# Patient Record
Sex: Male | Born: 1950 | Race: Black or African American | Hispanic: No | Marital: Single | Smoking: Current some day smoker
Health system: Southern US, Community
[De-identification: ages and names within clinical notes are randomized; demographics above are authoritative.]

## PROBLEM LIST (undated history)

## (undated) DIAGNOSIS — B20 Human immunodeficiency virus [HIV] disease: Secondary | ICD-10-CM

---

## 1992-08-22 ENCOUNTER — Encounter: Payer: Self-pay | Admitting: Internal Medicine

## 1998-10-30 ENCOUNTER — Encounter: Payer: Self-pay | Admitting: Emergency Medicine

## 1998-10-30 ENCOUNTER — Emergency Department (HOSPITAL_COMMUNITY): Admission: EM | Admit: 1998-10-30 | Discharge: 1998-10-30 | Payer: Self-pay | Admitting: Emergency Medicine

## 1999-02-19 ENCOUNTER — Encounter: Payer: Self-pay | Admitting: Emergency Medicine

## 1999-02-19 ENCOUNTER — Emergency Department (HOSPITAL_COMMUNITY): Admission: EM | Admit: 1999-02-19 | Discharge: 1999-02-19 | Payer: Self-pay | Admitting: Emergency Medicine

## 1999-06-19 ENCOUNTER — Emergency Department (HOSPITAL_COMMUNITY): Admission: EM | Admit: 1999-06-19 | Discharge: 1999-06-19 | Payer: Self-pay | Admitting: *Deleted

## 2000-03-13 ENCOUNTER — Emergency Department (HOSPITAL_COMMUNITY): Admission: EM | Admit: 2000-03-13 | Discharge: 2000-03-14 | Payer: Self-pay | Admitting: Emergency Medicine

## 2000-03-24 ENCOUNTER — Emergency Department (HOSPITAL_COMMUNITY): Admission: EM | Admit: 2000-03-24 | Discharge: 2000-03-24 | Payer: Self-pay

## 2004-08-30 ENCOUNTER — Ambulatory Visit: Payer: Self-pay | Admitting: Internal Medicine

## 2005-03-25 ENCOUNTER — Inpatient Hospital Stay (HOSPITAL_COMMUNITY): Admission: EM | Admit: 2005-03-25 | Discharge: 2005-03-28 | Payer: Self-pay | Admitting: Emergency Medicine

## 2005-06-10 ENCOUNTER — Ambulatory Visit: Payer: Self-pay | Admitting: Internal Medicine

## 2006-06-16 ENCOUNTER — Ambulatory Visit: Payer: Self-pay | Admitting: Internal Medicine

## 2006-07-12 ENCOUNTER — Inpatient Hospital Stay (HOSPITAL_COMMUNITY): Admission: EM | Admit: 2006-07-12 | Discharge: 2006-07-18 | Payer: Self-pay | Admitting: Emergency Medicine

## 2006-07-14 ENCOUNTER — Ambulatory Visit: Payer: Self-pay | Admitting: Infectious Diseases

## 2006-07-24 ENCOUNTER — Ambulatory Visit: Payer: Self-pay | Admitting: Internal Medicine

## 2006-11-27 ENCOUNTER — Ambulatory Visit: Payer: Self-pay | Admitting: Internal Medicine

## 2007-07-16 ENCOUNTER — Ambulatory Visit: Payer: Self-pay | Admitting: Internal Medicine

## 2007-07-16 LAB — CONVERTED CEMR LAB
AST: 31 units/L (ref 0–37)
Alkaline Phosphatase: 115 units/L (ref 39–117)
Basophils Relative: 0 % (ref 0–1)
CD4 T Helper %: 20 % — ABNORMAL LOW (ref 32–62)
Calcium: 9.7 mg/dL (ref 8.4–10.5)
Eosinophils Absolute: 0.2 10*3/uL (ref 0.0–0.7)
HIV-1 RNA Quant, Log: 4.12 — ABNORMAL HIGH (ref <1.70)
Hemoglobin: 14.7 g/dL (ref 13.0–17.0)
Lymphocytes Relative: 16 % (ref 12–46)
MCV: 93.7 fL (ref 78.0–100.0)
Neutro Abs: 5.2 10*3/uL (ref 1.7–7.7)
Platelets: 232 10*3/uL (ref 150–400)
RDW: 13.2 % (ref 11.5–15.5)
Sodium: 137 meq/L (ref 135–145)
Total Bilirubin: 0.4 mg/dL (ref 0.3–1.2)
Total Lymphocytes %: 16 % (ref 12–46)
WBC, lymph enumeration: 7.4 10*3/uL (ref 4.0–10.5)

## 2007-08-27 ENCOUNTER — Ambulatory Visit: Payer: Self-pay | Admitting: Internal Medicine

## 2007-11-05 ENCOUNTER — Ambulatory Visit: Payer: Self-pay | Admitting: Internal Medicine

## 2007-11-06 ENCOUNTER — Encounter: Payer: Self-pay | Admitting: Internal Medicine

## 2007-11-06 LAB — CONVERTED CEMR LAB
BUN: 15 mg/dL (ref 6–23)
Basophils Absolute: 0 10*3/uL (ref 0.0–0.1)
Basophils Relative: 1 % (ref 0–1)
Chlamydia, Swab/Urine, PCR: NEGATIVE
Chloride: 102 meq/L (ref 96–112)
Creatinine, Ser: 1.35 mg/dL (ref 0.40–1.50)
HCT: 47.2 % (ref 39.0–52.0)
Hemoglobin: 15.2 g/dL (ref 13.0–17.0)
Lymphocytes Relative: 22 % (ref 12–46)
Lymphs Abs: 0.7 10*3/uL (ref 0.7–4.0)
MCHC: 32.2 g/dL (ref 30.0–36.0)
MCV: 94.4 fL (ref 78.0–100.0)
Monocytes Absolute: 0.4 10*3/uL (ref 0.1–1.0)
Monocytes Relative: 14 % — ABNORMAL HIGH (ref 3–12)
Neutro Abs: 1.9 10*3/uL (ref 1.7–7.7)
Neutrophils Relative %: 60 % (ref 43–77)
Platelets: 198 10*3/uL (ref 150–400)
Potassium: 4.1 meq/L (ref 3.5–5.3)
RBC: 5 M/uL (ref 4.22–5.81)
RDW: 13.5 % (ref 11.5–15.5)
Sodium: 138 meq/L (ref 135–145)

## 2008-04-08 ENCOUNTER — Encounter: Payer: Self-pay | Admitting: Internal Medicine

## 2008-04-28 ENCOUNTER — Ambulatory Visit: Payer: Self-pay | Admitting: Internal Medicine

## 2008-04-28 LAB — CONVERTED CEMR LAB
ALT: 19 units/L (ref 0–53)
AST: 29 units/L (ref 0–37)
Absolute CD4: 148 #/uL — ABNORMAL LOW (ref 381–1469)
BUN: 16 mg/dL (ref 6–23)
Basophils Absolute: 0 10*3/uL (ref 0.0–0.1)
Basophils Relative: 0 % (ref 0–1)
CD4 T Helper %: 15 % — ABNORMAL LOW (ref 32–62)
Calcium: 9.8 mg/dL (ref 8.4–10.5)
Creatinine, Ser: 1.49 mg/dL (ref 0.40–1.50)
HCT: 44 % (ref 39.0–52.0)
HIV 1 RNA Quant: 5310 copies/mL — ABNORMAL HIGH (ref <50)
Hemoglobin: 14 g/dL (ref 13.0–17.0)
Lymphocytes Relative: 26 % (ref 12–46)
Lymphs Abs: 1 10*3/uL (ref 0.7–4.0)
MCHC: 31.8 g/dL (ref 30.0–36.0)
MCV: 93.8 fL (ref 78.0–100.0)
Monocytes Absolute: 0.6 10*3/uL (ref 0.1–1.0)
Platelets: 221 10*3/uL (ref 150–400)
Potassium: 4.1 meq/L (ref 3.5–5.3)
RBC: 4.69 M/uL (ref 4.22–5.81)
RDW: 14 % (ref 11.5–15.5)
Sodium: 137 meq/L (ref 135–145)
Total Bilirubin: 0.5 mg/dL (ref 0.3–1.2)
Total Protein: 8.3 g/dL (ref 6.0–8.3)
Total lymphocyte count: 988 cells/mcL (ref 700–3300)
WBC, lymph enumeration: 3.8 10*3/uL — ABNORMAL LOW (ref 4.0–10.5)

## 2008-05-09 ENCOUNTER — Encounter: Payer: Self-pay | Admitting: Internal Medicine

## 2008-05-19 ENCOUNTER — Ambulatory Visit: Payer: Self-pay | Admitting: Internal Medicine

## 2008-08-29 ENCOUNTER — Ambulatory Visit: Payer: Self-pay | Admitting: Internal Medicine

## 2008-08-29 ENCOUNTER — Encounter: Payer: Self-pay | Admitting: Internal Medicine

## 2008-08-29 DIAGNOSIS — M25579 Pain in unspecified ankle and joints of unspecified foot: Secondary | ICD-10-CM

## 2008-08-29 DIAGNOSIS — F39 Unspecified mood [affective] disorder: Secondary | ICD-10-CM | POA: Insufficient documentation

## 2008-08-29 DIAGNOSIS — F1021 Alcohol dependence, in remission: Secondary | ICD-10-CM

## 2008-08-29 DIAGNOSIS — B20 Human immunodeficiency virus [HIV] disease: Secondary | ICD-10-CM

## 2008-08-29 DIAGNOSIS — A539 Syphilis, unspecified: Secondary | ICD-10-CM

## 2008-08-29 LAB — CONVERTED CEMR LAB
AST: 27 units/L (ref 0–37)
Absolute CD4: 103 #/uL — ABNORMAL LOW (ref 381–1469)
Basophils Absolute: 0 10*3/uL (ref 0.0–0.1)
CD4 T Helper %: 17 % — ABNORMAL LOW (ref 32–62)
Calcium: 9 mg/dL (ref 8.4–10.5)
Cholesterol: 178 mg/dL (ref 0–200)
Creatinine, Ser: 1.68 mg/dL — ABNORMAL HIGH (ref 0.40–1.50)
HDL: 47 mg/dL (ref >39)
HIV-1 RNA Quant, Log: 3.84 — ABNORMAL HIGH (ref <1.68)
Hemoglobin: 14.4 g/dL (ref 13.0–17.0)
MCV: 92.7 fL (ref 78.0–100.0)
Potassium: 4.5 meq/L (ref 3.5–5.3)
RBC: 4.94 M/uL (ref 4.22–5.81)
Sodium: 138 meq/L (ref 135–145)
Total Bilirubin: 0.4 mg/dL (ref 0.3–1.2)
Total Protein: 8.4 g/dL — ABNORMAL HIGH (ref 6.0–8.3)
Triglycerides: 252 mg/dL — ABNORMAL HIGH (ref <150)
VLDL: 50 mg/dL — ABNORMAL HIGH (ref 0–40)

## 2008-12-14 ENCOUNTER — Telehealth: Payer: Self-pay | Admitting: Internal Medicine

## 2009-01-11 ENCOUNTER — Telehealth: Payer: Self-pay | Admitting: Internal Medicine

## 2009-02-27 ENCOUNTER — Encounter: Payer: Self-pay | Admitting: Internal Medicine

## 2009-02-27 ENCOUNTER — Ambulatory Visit: Payer: Self-pay | Admitting: Internal Medicine

## 2009-02-27 LAB — CONVERTED CEMR LAB
AST: 29 units/L (ref 0–37)
Albumin: 4.2 g/dL (ref 3.5–5.2)
Basophils Absolute: 0 10*3/uL (ref 0.0–0.1)
CO2: 25 meq/L (ref 19–32)
Chloride: 99 meq/L (ref 96–112)
Eosinophils Absolute: 0.1 10*3/uL (ref 0.0–0.7)
HCT: 46.2 % (ref 39.0–52.0)
HIV 1 RNA Quant: 20000 copies/mL — ABNORMAL HIGH (ref <48)
HIV-1 RNA Quant, Log: 4.3 — ABNORMAL HIGH (ref <1.68)
Hemoglobin: 14.6 g/dL (ref 13.0–17.0)
Neutro Abs: 3.3 10*3/uL (ref 1.7–7.7)
Neutrophils Relative %: 68 % (ref 43–77)
Potassium: 4 meq/L (ref 3.5–5.3)
RBC: 4.87 M/uL (ref 4.22–5.81)
RDW: 14.9 % (ref 11.5–15.5)
Sodium: 139 meq/L (ref 135–145)
Total Protein: 8.6 g/dL — ABNORMAL HIGH (ref 6.0–8.3)
Total lymphocyte count: 900 cells/mcL (ref 700–3300)
WBC: 5 10*3/uL (ref 4.0–10.5)

## 2009-03-09 ENCOUNTER — Encounter: Payer: Self-pay | Admitting: Internal Medicine

## 2009-03-09 ENCOUNTER — Ambulatory Visit: Payer: Self-pay | Admitting: Internal Medicine

## 2009-06-06 ENCOUNTER — Telehealth: Payer: Self-pay | Admitting: Internal Medicine

## 2009-07-07 ENCOUNTER — Encounter: Payer: Self-pay | Admitting: Internal Medicine

## 2009-08-21 ENCOUNTER — Telehealth: Payer: Self-pay | Admitting: Internal Medicine

## 2009-08-24 ENCOUNTER — Encounter: Payer: Self-pay | Admitting: Internal Medicine

## 2009-08-24 ENCOUNTER — Ambulatory Visit: Payer: Self-pay | Admitting: Internal Medicine

## 2009-08-24 LAB — CONVERTED CEMR LAB
ALT: 20 units/L (ref 0–53)
Alkaline Phosphatase: 109 units/L (ref 39–117)
BUN: 14 mg/dL (ref 6–23)
Basophils Absolute: 0 10*3/uL (ref 0.0–0.1)
Chloride: 101 meq/L (ref 96–112)
Creatinine, Ser: 1.54 mg/dL — ABNORMAL HIGH (ref 0.40–1.50)
Eosinophils Relative: 5 % (ref 0–5)
Glucose, Bld: 89 mg/dL (ref 70–99)
HCT: 41.6 % (ref 39.0–52.0)
Lymphocytes Relative: 14 % (ref 12–46)
Monocytes Absolute: 0.5 10*3/uL (ref 0.1–1.0)
Neutro Abs: 3.1 10*3/uL (ref 1.7–7.7)
Sodium: 136 meq/L (ref 135–145)
Total Bilirubin: 0.3 mg/dL (ref 0.3–1.2)

## 2009-09-27 ENCOUNTER — Telehealth: Payer: Self-pay | Admitting: Internal Medicine

## 2009-12-01 ENCOUNTER — Telehealth: Payer: Self-pay | Admitting: Internal Medicine

## 2010-02-04 ENCOUNTER — Emergency Department (HOSPITAL_COMMUNITY): Admission: EM | Admit: 2010-02-04 | Discharge: 2010-02-04 | Payer: Self-pay | Admitting: Emergency Medicine

## 2010-03-02 ENCOUNTER — Telehealth: Payer: Self-pay | Admitting: Internal Medicine

## 2010-03-08 ENCOUNTER — Telehealth (INDEPENDENT_AMBULATORY_CARE_PROVIDER_SITE_OTHER): Payer: Self-pay | Admitting: *Deleted

## 2010-03-08 ENCOUNTER — Ambulatory Visit: Payer: Self-pay | Admitting: Internal Medicine

## 2010-03-08 LAB — CONVERTED CEMR LAB
AST: 34 units/L (ref 0–37)
BUN: 12 mg/dL (ref 6–23)
Eosinophils Relative: 3 % (ref 0–5)
Glucose, Bld: 109 mg/dL — ABNORMAL HIGH (ref 70–99)
HIV 1 RNA Quant: <20 copies/mL (ref <48)
HIV-1 RNA Quant, Log: <1.3 (ref <1.68)
Hemoglobin: 14.3 g/dL (ref 13.0–17.0)
MCHC: 32.7 g/dL (ref 30.0–36.0)
MCV: 95.2 fL (ref 78.0–100.0)
Monocytes Absolute: 0.4 10*3/uL (ref 0.1–1.0)
Neutro Abs: 1.9 10*3/uL (ref 1.7–7.7)
Neutrophils Relative %: 51 % (ref 43–77)
RBC: 4.59 M/uL (ref 4.22–5.81)
RDW: 13.7 % (ref 11.5–15.5)
Total Bilirubin: 0.5 mg/dL (ref 0.3–1.2)
WBC: 3.8 10*3/uL — ABNORMAL LOW (ref 4.0–10.5)

## 2010-03-21 ENCOUNTER — Ambulatory Visit: Payer: Self-pay | Admitting: Internal Medicine

## 2010-03-28 ENCOUNTER — Telehealth (INDEPENDENT_AMBULATORY_CARE_PROVIDER_SITE_OTHER): Payer: Self-pay | Admitting: *Deleted

## 2010-04-02 ENCOUNTER — Encounter: Payer: Self-pay | Admitting: Internal Medicine

## 2010-05-04 ENCOUNTER — Encounter: Payer: Self-pay | Admitting: Internal Medicine

## 2010-05-21 ENCOUNTER — Encounter: Payer: Self-pay | Admitting: Internal Medicine

## 2010-06-13 ENCOUNTER — Emergency Department (HOSPITAL_COMMUNITY)
Admission: EM | Admit: 2010-06-13 | Discharge: 2010-06-13 | Payer: Self-pay | Source: Home / Self Care | Admitting: Emergency Medicine

## 2010-06-14 ENCOUNTER — Emergency Department (HOSPITAL_COMMUNITY)
Admission: EM | Admit: 2010-06-14 | Discharge: 2010-06-14 | Payer: Self-pay | Source: Home / Self Care | Admitting: Emergency Medicine

## 2010-07-31 NOTE — Progress Notes (Signed)
Summary: med refill  Phone Note Refill Request   Refills Requested: Medication #1:  EFFEXOR XR 150 MG XR24H-CAP Take 1 tablet by mouth once a day   Dosage confirmed as above?Dosage Confirmed   Brand Name Necessary? No   Supply Requested: 1 month Needs an appt.   Method Requested: Telephone to Pharmacy Next Appointment Scheduled: seen Sept.2010 Initial call taken by: Sharen Heck RN,  August 21, 2009 5:39 PM    Prescriptions: EFFEXOR XR 150 MG XR24H-CAP (VENLAFAXINE HCL) Take 1 tablet by mouth once a day  #30 x 3   Entered by:   Sharen Heck RN   Authorized by:   Yisroel Ramming MD   Signed by:   Yisroel Ramming MD on 08/22/2009   Method used:   Telephoned to ...         RxID:   [id number]  Called to [facility] [address] Ginette Otto [phone].

## 2010-07-31 NOTE — Progress Notes (Signed)
Summary: Refill request for Xanax 2mg   Phone Note From Pharmacy   Caller: [facility] [address] Reason for Call: Needs renewal Summary of Call: request for refill on Alprazolam 2mg .  Patient was just seen in office on 03/21/10  Follow-up for Phone Call        ok with 3 refills Follow-up by: Yisroel Ramming MD,  March 29, 2010 9:35 AM  Additional Follow-up for Phone Call Additional follow up Details #1::        Pharmacy notified Additional Follow-up by: Paulo Fruit  BS,CPht II,MPH,  March 29, 2010 9:49 AM

## 2010-07-31 NOTE — Progress Notes (Signed)
Summary: refill/mld  Phone Note Call from Patient   Caller: Patient Reason for Call: Refill Medication Summary of Call: pateint called about his refill on the Gabapentin.  He uses [address]  He has scheduled a lab appt for 9/7 and a f/u 2 weeks after that. Initial call taken by: Paulo Fruit  BS,CPht II,MPH,  March 02, 2010 4:08 PM  Follow-up for Phone Call        spoke to [place] who stated to refill one time and must keep appt.  Patient aware. Follow-up by: Paulo Fruit  BS,CPht II,MPH,  March 02, 2010 4:08 PM    Prescriptions: NEURONTIN 400 MG CAPS (GABAPENTIN) Take 1 tablet by mouth three times a day  #90 Each x 0   Entered by:   Paulo Fruit  BS,CPht II,MPH   Authorized by:   Yisroel Ramming MD   Signed by:   Paulo Fruit  BS,CPht II,MPH on 03/02/2010   Method used:   Electronically to        [facility] [address] [id number] (retail)       [address] [place], [place]  [address]       Ph: [phone]       Fax: [phone]   RxID:   [id number]  Paulo Fruit  BS,CPht II,MPH  March 02, 2010 4:09 PM

## 2010-07-31 NOTE — Miscellaneous (Signed)
Summary: RW Update  Clinical Lists Changes  Observations: Added new observation of DATE1STVISIT: 03/21/2010 (04/02/2010 16:03) Added new observation of RWPARTICIP: Yes (04/02/2010 16:03)

## 2010-07-31 NOTE — Miscellaneous (Signed)
Summary: documentation of flu injection  Clinical Lists Changes  Observations: Added new observation of FLU VAX: Historical (04/03/2009 11:48)      Influenza Immunization History:    Influenza # 1:  Historical (04/03/2009)     Sharen Heck RN

## 2010-07-31 NOTE — Miscellaneous (Signed)
Summary: RW  Update  Clinical Lists Changes 

## 2010-07-31 NOTE — Miscellaneous (Signed)
Summary: HIPAA Restrictions  HIPAA Restrictions   Imported By: Florinda Marker 03/09/2010 10:58:40  _____________________________________________________________________  External Attachment:    Type:   Image     Comment:   External Document

## 2010-07-31 NOTE — Miscellaneous (Signed)
Summary: Orders Update  Clinical Lists Changes  Orders: Added new Test order of T-Comprehensive Metabolic Panel ([id number]) - Signed Added new Test order of T-CBC w/Diff (85025-10010) - Signed Added new Test order of T-CD4SP ([facility]) (CD4SP) - Signed Added new Test order of T-HIV Viral Load ([id number]) - Signed 

## 2010-07-31 NOTE — Assessment & Plan Note (Signed)
Summary: F/U [MKJ]   CC:  follow-up visit, lab results, pt. c/o stress and anxiety, and Depression.  History of Present Illness: Pt c/o some anxiety. He has been taking his HIV meds every day.  Depression History:      The patient is having a depressed mood most of the day but denies diminished interest in his usual daily activities.        The patient denies that he feels like life is not worth living, denies that he wishes that he were dead, and denies that he has thought about ending his life.        Preventive Screening-Counseling & Management  Alcohol-Tobacco     Alcohol drinks/day: <1     Alcohol type: vodka     Smoking Status: current     Smoking Cessation Counseling: yes     Packs/Day: <0.25  Caffeine-Diet-Exercise     Caffeine use/day: 1     Does Patient Exercise: yes     Type of exercise: walking     Exercise (avg: min/session): <30     Times/week: <3  Safety-Violence-Falls     Seat Belt Use: yes   Updated Prior Medication List: EFFEXOR XR 150 MG XR24H-CAP (VENLAFAXINE HCL) Take 1 tablet by mouth once a day NEURONTIN 400 MG CAPS (GABAPENTIN) Take 1 tablet by mouth three times a day TRUVADA 200-300 MG TABS (EMTRICITABINE-TENOFOVIR) Take 1 tablet by mouth once a day ISENTRESS 400 MG TABS (RALTEGRAVIR POTASSIUM) Take 1 tablet by mouth two times a day ALPRAZOLAM 2 MG TABS (ALPRAZOLAM) Take 1 tablet by mouth three times a day  Current Allergies (reviewed today): ! PCN Review of Systems  The patient denies anorexia, fever, and weight loss.    Vital Signs:  Patient profile:   60 year old male Height:      70.5 inches (179.07 cm) Weight:      201.8 pounds (91.73 kg) BMI:     28.65 Temp:     98.3 degrees F (36.83 degrees C) oral Pulse rate:   100 / minute BP sitting:   154 / 89  (right arm)  Vitals Entered By: Wendall Mola CMA Duncan Dull) (March 21, 2010 4:04 PM) CC: follow-up visit, lab results, pt. c/o stress and anxiety, Depression Is Patient  Diabetic? No Pain Assessment Patient in pain? no      Nutritional Status BMI of 25 - 29 = overweight Nutritional Status Detail appetite "good"  Does patient need assistance? Functional Status Self care Ambulation Normal Comments no missed doses of HAART meds per pt.   Physical Exam  General:  alert, well-developed, well-nourished, and well-hydrated.   Head:  normocephalic and atraumatic.   Mouth:  pharynx pink and moist.   Lungs:  normal breath sounds.      Impression & Recommendations:  Problem # 1:  HIV DISEASE (ICD-042) Pt.s most recent CD4ct was 200 and VL <20 .  Pt instructed to continue the current antiretroviral regimen.  Pt encouraged to take medication regularly and not miss doses.  Pt will f/u in 3 months for repeat blood work and will see me 2 weeks later. Influenza vaccine today.  The following medications were removed from the medication list:    Bactrim Ds 800-160 Mg Tabs (Sulfamethoxazole-trimethoprim) .Marland Kitchen... Take 1 tablet by mouth once a day  Diagnostics Reviewed:  HIV: CDC-defined AIDS (08/24/2009)   CD4: 200 (03/09/2010)   WBC: 3.8 (03/08/2010)   PMN (bands): 0 (08/29/2008)   Hgb: 14.3 (03/08/2010)   HCT: 43.7 (  03/08/2010)   Platelets: 199 (03/08/2010) HIV genotype: See Comment (02/27/2009)   HIV-1 RNA: <20 copies/mL (03/08/2010)     Problem # 2:  MOOD DISORDER (ICD-296.90) refer to Psychiatrist  Other Orders: Est. Patient Level III (14782) Influenza Vaccine MCR (95621) Future Orders: T-CD4SP ([facility]) (CD4SP) ... 06/19/2010 T-HIV Viral Load [phone]) ... 06/19/2010 T-Comprehensive Metabolic Panel [phone]) ... 06/19/2010 T-CBC w/Diff ([id number]) ... 06/19/2010 T-Lipid Profile [phone]) ... 06/19/2010  Patient Instructions: 1)  Please schedule a follow-up appointment in 3 months, 2 weeks after labs.      Immunizations Administered:  Influenza Vaccine # 1:    Vaccine Type: Fluvax MCR    Site: right deltoid    Mfr: Novartis     Dose: 0.5 ml    Route: IM    Given by: Wendall Mola CMA ( AAMA)    Exp. Date: 09/30/2010    Lot #: [id number]    VIS given: 01/23/10 version given March 21, 2010.  Flu Vaccine Consent Questions:    Do you have a history of severe allergic reactions to this vaccine? no    Any prior history of allergic reactions to egg and/or gelatin? no    Do you have a sensitivity to the preservative Thimersol? no    Do you have a past history of Guillan-Barre Syndrome? no    Do you currently have an acute febrile illness? no    Have you ever had a severe reaction to latex? no    Vaccine information given and explained to patient? yes

## 2010-07-31 NOTE — Progress Notes (Signed)
Summary: refill request for Xanax  Phone Note From Pharmacy   Reason for Call: Needs renewal Summary of Call: [facility] [address] request a renewal on patient's behalf for Xanax. Initial call taken by: Paulo Fruit  BS,CPht II,MPH,  December 01, 2009 12:10 PM  Follow-up for Phone Call        ok x 3 Follow-up by: Yisroel Ramming MD,  December 01, 2009 2:30 PM  Additional Follow-up for Phone Call Additional follow up Details #1::        Prescription resent Additional Follow-up by: Paulo Fruit  BS,CPht II,MPH,  December 01, 2009 4:22 PM

## 2010-07-31 NOTE — Progress Notes (Signed)
Summary: Refill Effexor  Phone Note Call from Patient   Caller: Patient Summary of Call: Pt. came in for labs and needed refill on Effexor, refill [facility] to [facility] at [address] x 1 month.  Pt. will need to ask for more refills at office visit. Initial call taken by: Wendall Mola CMA Duncan Dull),  March 08, 2010 3:54 PM

## 2010-07-31 NOTE — Progress Notes (Signed)
Summary: call re Xanax  Phone Note Call from Patient   Reason for Call: Talk to Nurse Summary of Call: Patient thinks someone may have taken his Xanax. Pt. advised rx was given over a month ago and should be able to go ahead and get a refill. Initial call taken by: Sharen Heck RN,  September 27, 2009 3:24 PM  Follow-up for Phone Call        ok Follow-up by: Yisroel Ramming MD,  September 28, 2009 11:11 AM

## 2010-07-31 NOTE — Miscellaneous (Signed)
Summary: Office Visit ([facility])    Vital Signs:  Patient profile:   60 year old male Weight:      205.4 pounds Temp:     97.9 degrees F oral Pulse rate:   85 / minute Pulse rhythm:   regular Resp:     18 per minute BP sitting:   127 / 73  Vitals Entered By: Sharen Heck RN (August 24, 2009 3:22 PM) CC: f/u 05, reports mental stress Is Patient Diabetic? No Pain Assessment Patient in pain? no       Does patient need assistance? Functional Status Self care Ambulation Normal   CC:  f/u 05 and reports mental stress.  History of Present Illness: Pt thinks he needs something stronger for his anxiety. He has been taking his HIV meds every day. He has not had any side effects.  Preventive Screening-Counseling & Management  Alcohol-Tobacco     Alcohol drinks/day: <1     Alcohol type: vodka     Smoking Status: current     Smoking Cessation Counseling: yes     Packs/Day: <0.25  Caffeine-Diet-Exercise     Caffeine use/day: 1     Does Patient Exercise: yes     Type of exercise: walking     Exercise (avg: min/session): <30     Times/week: <3  Current Problems (verified): 1)  Ankle Pain, Right  (ICD-719.47) 2)  Alcohol Abuse, Hx of  (ICD-V11.3) 3)  Mood Disorder  (ICD-296.90) 4)  Hx of Syphilis  (ICD-097.9) 5)  HIV Disease  (ICD-042)  Current Medications (verified): 1)  Effexor Xr 150 Mg Xr24h-Cap (Venlafaxine Hcl) .... Take 1 Tablet By Mouth Once A Day 2)  Bactrim Ds 800-160 Mg Tabs (Sulfamethoxazole-Trimethoprim) .... Take 1 Tablet By Mouth Once A Day 3)  Neurontin 400 Mg Caps (Gabapentin) .... Take 1 Tablet By Mouth Three Times A Day 4)  Truvada 200-300 Mg Tabs (Emtricitabine-Tenofovir) .... Take 1 Tablet By Mouth Once A Day 5)  Isentress 400 Mg Tabs (Raltegravir Potassium) .... Take 1 Tablet By Mouth Two Times A Day 6)  Alprazolam 2 Mg Tabs (Alprazolam) .... Take 1 Tablet By Mouth Three Times A Day  Allergies: 1)  ! Pcn   Review of Systems  The  patient denies anorexia, fever, and weight loss.     Physical Exam  General:  alert, well-developed, well-nourished, and well-hydrated.   Head:  normocephalic and atraumatic.   Mouth:  pharynx pink and moist.   Lungs:  normal breath sounds.          Medication Adherence: 08/24/2009   Adherence to medications reviewed with patient. Counseling to provide adequate adherence provided   Prevention For Positives: 08/24/2009   Safe sex practices discussed with patient. Condoms offered.                             Impression & Recommendations:  Problem # 1:  HIV DISEASE (ICD-042) Will obtain labs today. Pt to continue his current meds. Diagnostics Reviewed:  WBC: 5.0 (02/27/2009)   PMN (bands): 0 (08/29/2008)   Hgb: 14.6 (02/27/2009)   HCT: 46.2 (02/27/2009)   Platelets: 217 (02/27/2009) HIV genotype: See Comment (02/27/2009)   HIV-1 RNA: 20000 (02/27/2009)     Problem # 2:  MOOD DISORDER (ICD-296.90) Will increase his xanax to 2mg  three times a day.  Medications Added to Medication List This Visit: 1)  Alprazolam 2 Mg Tabs (Alprazolam) .... Take 1 tablet by  mouth three times a day  Other Orders: T-CD4SP [facility]) (CD4SP) T-HIV Viral Load [phone]) T-Comprehensive Metabolic Panel [phone]) T-CBC w/Diff [phone]) T-RPR (Syphilis) ([id number]) Est. Patient Level III (28413)    Patient Instructions: 1)  Please schedule a follow-up appointment in 3 months. Prescriptions: ALPRAZOLAM 2 MG TABS (ALPRAZOLAM) Take 1 tablet by mouth three times a day  #90 x 2   Entered and Authorized by:   Yisroel Ramming MD   Signed by:   Yisroel Ramming MD on 08/24/2009   Method used:   Print then Give to Patient   RxID:   [id number]

## 2010-07-31 NOTE — Miscellaneous (Signed)
Summary: RW Update  Clinical Lists Changes  Observations: Added new observation of PAYOR: Medicaid (05/04/2010 10:35) Added new observation of GENDER: Male (05/04/2010 10:35) Added new observation of RACE: African American (05/04/2010 10:35)

## 2010-08-20 ENCOUNTER — Encounter (INDEPENDENT_AMBULATORY_CARE_PROVIDER_SITE_OTHER): Payer: Self-pay | Admitting: *Deleted

## 2010-08-28 NOTE — Miscellaneous (Signed)
  Clinical Lists Changes  Observations: Added new observation of HIV RISK BEH: MSM (08/20/2010 11:15)

## 2010-09-11 ENCOUNTER — Telehealth: Payer: Self-pay | Admitting: Infectious Diseases

## 2010-09-13 LAB — T-HELPER CELL (CD4) - (RCID CLINIC ONLY): CD4 % Helper T Cell: 16 % — ABNORMAL LOW (ref 33–55)

## 2010-09-14 LAB — CBC
MCH: 31.7 pg (ref 26.0–34.0)
MCV: 96.2 fL (ref 78.0–100.0)
Platelets: 200 10*3/uL (ref 150–400)
RBC: 4.76 MIL/uL (ref 4.22–5.81)
RDW: 13.8 % (ref 11.5–15.5)

## 2010-09-14 LAB — COMPREHENSIVE METABOLIC PANEL
ALT: 31 U/L (ref 0–53)
BUN: 12 mg/dL (ref 6–23)
Calcium: 8.9 mg/dL (ref 8.4–10.5)
Creatinine, Ser: 1.25 mg/dL (ref 0.4–1.5)
GFR calc Af Amer: >60 mL/min (ref >60)
Potassium: 3.7 mEq/L (ref 3.5–5.1)
Sodium: 143 mEq/L (ref 135–145)

## 2010-09-14 LAB — DIFFERENTIAL
Basophils Absolute: 0 10*3/uL (ref 0.0–0.1)
Lymphocytes Relative: 26 % (ref 12–46)
Monocytes Relative: 8 % (ref 3–12)
Neutrophils Relative %: 62 % (ref 43–77)

## 2010-09-14 LAB — URINALYSIS, ROUTINE W REFLEX MICROSCOPIC
Hgb urine dipstick: NEGATIVE
Specific Gravity, Urine: 1.009 (ref 1.005–1.030)

## 2010-09-27 NOTE — Progress Notes (Signed)
  Phone Note Call from Patient   Caller: Patient Reason for Call: Refill Medication, Referral Action Taken: Phone Call Completed Summary of Call: wanted xanax refilled at higher dose.states he sometimes takes it 5 times a day & it is not working. unclear if he is taking his antidepressant or not.states he is worried about being thrown out of public housing & becoming homeless again. states he is very anxious and depressed. denies SI but states it would be nice to see his mom & dog again. states his religion prohibits suicide & he is "too chicken" has a case worker at [employer]. not currently under mental health provider. I told him we will not change meds & he is due for a visit. he will call & make an appt here to see new provider & discuss meds. [place] is on a bus line. also gave him the number to the [facility] center & he promised to call them. told him where it is & it is on a bus line. told him how good they are at helping & perhaps he needed different meds ot speaking with a therapist. he did not feel he needed to talk with anyone. his speech was fast & tangential. told him they have a 24 hr walk in side to [facility] center if he felt out of control.  Initial call taken by: Golden Circle RN,  September 11, 2010 2:34 PM  Follow-up for Phone Call        same request & message. he has an appt at [facility] center on the 30th.encouraged him to keep it as his appt here is later than that. suggested hanging with friends, watching TV or reading as a distraction. told him if he uses more than is ordered, he will run out  before it is time for a refill.  Follow-up by: Golden Circle RN,  September 17, 2010 3:08 PM

## 2010-10-01 ENCOUNTER — Other Ambulatory Visit (INDEPENDENT_AMBULATORY_CARE_PROVIDER_SITE_OTHER): Payer: Medicare Other

## 2010-10-01 DIAGNOSIS — B20 Human immunodeficiency virus [HIV] disease: Secondary | ICD-10-CM

## 2010-10-02 LAB — COMPLETE METABOLIC PANEL WITH GFR
ALT: 19 U/L (ref 0–53)
Albumin: 4.4 g/dL (ref 3.5–5.2)
BUN: 15 mg/dL (ref 6–23)
GFR, Est African American: 55 mL/min — ABNORMAL LOW (ref >60)
Glucose, Bld: 127 mg/dL — ABNORMAL HIGH (ref 70–99)
Sodium: 140 mEq/L (ref 135–145)
Total Bilirubin: 0.4 mg/dL (ref 0.3–1.2)
Total Protein: 7.8 g/dL (ref 6.0–8.3)

## 2010-10-02 LAB — LIPID PANEL
Cholesterol: 178 mg/dL (ref 0–200)
HDL: 43 mg/dL (ref >39)
Total CHOL/HDL Ratio: 4.1 Ratio
Triglycerides: 217 mg/dL — ABNORMAL HIGH (ref <150)
VLDL: 43 mg/dL — ABNORMAL HIGH (ref 0–40)

## 2010-10-02 LAB — CBC WITH DIFFERENTIAL/PLATELET
Basophils Relative: 0 % (ref 0–1)
Eosinophils Absolute: 0.1 10*3/uL (ref 0.0–0.7)
Eosinophils Relative: 2 % (ref 0–5)
MCH: 32 pg (ref 26.0–34.0)
MCV: 97.1 fL (ref 78.0–100.0)
Monocytes Absolute: 0.3 10*3/uL (ref 0.1–1.0)
Neutro Abs: 2.8 10*3/uL (ref 1.7–7.7)
Platelets: 223 10*3/uL (ref 150–400)
RBC: 5.16 MIL/uL (ref 4.22–5.81)
WBC: 3.8 10*3/uL — ABNORMAL LOW (ref 4.0–10.5)

## 2010-10-02 LAB — T-HELPER CELL (CD4) - (RCID CLINIC ONLY): CD4 % Helper T Cell: 23 % — ABNORMAL LOW (ref 33–55)

## 2010-10-03 ENCOUNTER — Other Ambulatory Visit: Payer: Self-pay | Admitting: Infectious Diseases

## 2010-10-03 LAB — HIV-1 RNA QUANT-NO REFLEX-BLD
HIV 1 RNA Quant: <20 copies/mL (ref <20)
HIV-1 RNA Quant, Log: <1.3 {Log} (ref <1.30)

## 2010-10-15 ENCOUNTER — Ambulatory Visit: Payer: Self-pay | Admitting: Infectious Diseases

## 2010-10-19 ENCOUNTER — Ambulatory Visit: Payer: Self-pay | Admitting: Infectious Diseases

## 2010-11-15 ENCOUNTER — Other Ambulatory Visit: Payer: Self-pay | Admitting: *Deleted

## 2010-11-15 DIAGNOSIS — B20 Human immunodeficiency virus [HIV] disease: Secondary | ICD-10-CM

## 2010-11-15 DIAGNOSIS — F419 Anxiety disorder, unspecified: Secondary | ICD-10-CM

## 2010-11-15 MED ORDER — ALPRAZOLAM 2 MG PO TABS: 2.0000 mg | ORAL_TABLET | Freq: Three times a day (TID) | ORAL | Status: DC | PRN

## 2010-11-15 MED ORDER — EMTRICITABINE-TENOFOVIR DF 200-300 MG PO TABS: 1.0000 | ORAL_TABLET | Freq: Every day | ORAL | Status: DC

## 2010-11-16 NOTE — Discharge Summary (Signed)
NAME:  David English, David English NO.:  [id number]   MEDICAL RECORD NO.:  [id number]          PATIENT TYPE:  INP   LOCATION:  1621                         FACILITY:  [facility]   PHYSICIAN:  Theone Stanley, MD   DATE OF BIRTH:  07-14-1950   DATE OF ADMISSION:  03/25/2005  DATE OF DISCHARGE:                                 DISCHARGE SUMMARY   ADMISSION DIAGNOSES:  1.  Cough, malaise, rhinorrhea, fever.  2.  Depression.  3.  Psoriasis.  4.  HIV positive since 2000.   DISCHARGE DIAGNOSES:  1.  Pneumonia, right upper lobe.  2.  Cough, malaise, rhinorrhea, fever.  3.  Depression.  4.  Psoriasis.  5.  HIV positive since 2000.   COMPLICATIONS:  None.   PROCEDURES:  None.   HOSPITAL COURSE:  [name] is a very pleasant 60 year old gentleman who  has known about his HIV positive test since 2000.  He is followed by Dr.  Philipp Deputy who monitors his CD4 count.  He recently had one.  However, he does  not know the level.  On his admission, it was noted that he had a right  upper lobe pneumonia.  Because of the presentation and his status, it was  deemed best to admit the patient for IV antibiotics. The patient was  admitted, started on azithromycin and Rocephin.  Over the next couple of  days, the patient improved dramatically.  It was felt that he was stable  enough to be discharged.  Of note, his pressures here in the hospital were  quite elevated at times.  I did give him a dose of Lasix and started him on  hydrochlorothiazide.  I do not know whether this is secondary to his current  illness or him being on Effexor.  He states that when he goes to the clinic,  nobody ever mentions anything about blood pressure.  However, since he is  elevated, it is best to start him on something here in the hospital with  follow up at his primary care physician. The patient was discharged on  September 28 in stable condition.   DISCHARGE MEDICATIONS:  1.  Effexor 150 mg one p.o. daily.  2.   Hydrochlorothiazide 12.5 mg one p.o. daily.  I gave him a total of 7      pills.  He is to follow up, and Dr. Philipp Deputy will decide to continue with      this or discontinue it.  3.  Ceftin 500 mg one p.o. b.i.d. for an additional 7 days.  4.  Guaifenesin 600 mg one p.o. b.i.d.   DISCHARGE INSTRUCTIONS:  The patient is to follow up with Dr. Philipp Deputy in 5-7  days.  At that point in time, blood pressure check will be done and Dr.  Philipp Deputy will decide whether to continue with his medications.      Theone Stanley, MD  Electronically Signed     AEJ/MEDQ  D:  03/28/2005  T:  03/28/2005  Job:  [phone] [id number]   cc:   Tresa Endo L. Philipp Deputy, M.D.  Fax: [phone]

## 2010-11-16 NOTE — Discharge Summary (Signed)
NAME:  David English, David English             ACCOUNT NO.:  [id number]   MEDICAL RECORD NO.:  [id number]          PATIENT TYPE:  INP   LOCATION:  1417                         FACILITY:  [facility]   PHYSICIAN:  Hind I Elsaid, MD      DATE OF BIRTH:  1950-08-04   DATE OF ADMISSION:  07/11/2006  DATE OF DISCHARGE:                               DISCHARGE SUMMARY   DISCHARGE DIAGNOSES:  1. Bilateral community-acquired pneumonia.  2. Human immunodeficiency virus with a CD4 of 240.  3. History of depression.  4. Acute renal failure, which was resolved.  5. Hypokalemia, resolved.   DISCHARGE MEDICATIONS:  1. Avelox 400 mg p.o. daily times 2 weeks.  2. predinsone  20 mg p.o. daily, taper dose for 4 days.  3. Diflucan 100 mg p.o. daily for oral thrush x5 days.   CONSULTATIONS:  ID consult.   PROCEDURE:  1. Chest x-ray on January 11th, bilateral infiltrate.  2. Chest x-ray on January 14th, air space disease in the left hilum      with pneumonia, __________ in the lung base, consistent with      pleural effusion.  3. Chest x-ray on January 17th, one base is improved.  No new      findings.   HISTORY OF PRESENT ILLNESS:  A 60 year old African-American male with a  history of HIV  .  Admitted to worse cough with sputum.  Chest x-ray as  above.   PROBLEM LIST:  1. Shortness of breath and diagnosis of community-acquired pneumonia      .patient was admitted to tele for close observation,blood culture      was neg ,  sputum for PCP was negative.  ID was consulted.  They      recommended patient to continue on IV antibiotics and to discharge      on Avelox.  Patient's condition significantly improved.  Patient      was discharged on Avelox to continue for two weeks.  2. HIV positive: with CD4 count 230,patient is not on any HAART      medicine  .  Patient is to follow with the infectious disease      clinic at [facility].  3. Depression:  Patient is to resume Effexor.  4. Acute renal failure:  which  respond to hydration   DISPOSITION:  Patient is going to be discharged home on Avelox for two  weeks and is to follow up with ID next week.  __________ .      Hind Bosie Helper, MD  Electronically Signed     HIE/MEDQ  D:  07/18/2006  T:  07/18/2006  Job:  [id number]

## 2010-11-16 NOTE — H&P (Signed)
NAME:  David English, CERASOLI NO.:  [id number]   MEDICAL RECORD NO.:  [id number]          PATIENT TYPE:  INP   LOCATION:  1621                         FACILITY:  [facility]   PHYSICIAN:  Sherin Quarry, MD      DATE OF BIRTH:  1951-02-11   DATE OF ADMISSION:  03/25/2005  DATE OF DISCHARGE:                                HISTORY & PHYSICAL   HISTORY OF PRESENT ILLNESS:  David English. David English is a 60 year old man who  states that he has been aware that he is HIV positive since 2000. As far as  I can tell from talking to him he has never taken any specific treatment. He  states that Dr. Philipp Deputy monitors his CD4 count but he is not sure what the  results have been. He states this was last checked 3 months ago. Mr. Mayabb  recalls that in 2001 he received treatment for what sounds like a routine  outpatient pneumonia. On Friday he indicates that he began to experience  cough, malaise and rhinorrhea and that night had a chill although it was not  a shaking chill, associated with rigors. Since then he has had proceed  persistent malaise, anorexia, chest aching and productive cough. By today,  i.e. Monday and it became clear to him that this was not going to get better  by itself and he therefore came to the [facility] emergency room for  evaluation. In the emergency room a chest x-ray was obtained which showed  evidence of a right upper lobe pneumonia. Given his immunocompromised state  it was felt prudent to admit him to the hospital for further treatment. He  denies any associated headache, visual blurring, nausea, vomiting, abdominal  pain or dysuria.   PAST MEDICAL HISTORY:   MEDICATIONS:  1.  Effexor 150 milligrams daily.  2.  A cream for psoriasis.   ALLERGIES:  He states that he is allergic to PENICILLIN but is not sure  about the details of this.   PAST SURGICAL HISTORY:  Operations are none.  medical illnesses.   MEDICAL ILLNESSES:  Are none except as stated above. The  patient states that  the Effexor seems to be useful in managing symptoms of depression. He is not  currently utilizing any treatment for psoriasis.   FAMILY HISTORY:  His father actually died of pneumonia in 13. His mother  died in 34 of heart disease. He is not having siblings.   SOCIAL HISTORY:  He smokes about one half-pack of cigarettes per day. He  states that he does not consume alcohol on a regular basis although  apparently when he does consume any, he consumes a rather large amount. He  states is not unusual for him to drink a fifth of alcohol on a weekend  although he will sometimes go for a prolonged period without drinking any at  all. He states he does not abuse any drugs he currently resides by himself.   REVIEW OF SYSTEMS:  HEAD:  He denies headache or dizziness. EYES:  Denies  visual blurring or diplopia. EAR/NOSE/THROAT:  He has some feeling of  fullness in his right ear without associated pain. CHEST:  See above. There  is been no hemoptysis.  CARDIOVASCULAR:  Denies orthopnea, PND, ankle edema  or exertional chest pain. GI:  He had one episode of loose stool today. GU:  Denies dysuria or urinary frequency. NEURO:  There is no history of seizure  or stroke. ENDOCRINE:  He denies excessive thirst, urinary frequency or  nocturia.   PHYSICAL EXAMINATION:  VITAL SIGNS:  His temperature is 101.1 blood pressure  156/91, pulse is 121, respirations were 20 and unlabored, O2 saturations 95%  GENERAL: The patient is very alert and cooperative. He appears to be mildly  acutely ill. HEENT EXAM:  The pupils were equal and reactive. Tympanic  membranes were obscured by cerumen, his right ear seems to be full of wax.  Examination of the throat shows no signs of oral thrush. The tongue appears  normal.  NECK:  Supple. There is no lymphadenopathy or thyromegaly.  CHEST:  Reveals a few scattered rhonchi, I do not hear any wheezes. I do not  hear any focal bronchial breath  sounds.  BACK:  No CVA or point tenderness.  CARDIOVASCULAR:  Reveals a tachycardia. There are no rubs or gallops.  ABDOMEN:  Benign. Normal bowel sounds without masses or tenderness.  NEUROLOGIC:  Testing is within normal limits.  EXTREMITIES: Reveals evidence of mild dermatitis consistent with psoriasis.   IMPRESSION:  1.  Right upper lobe pneumonia by chest x-ray.  2.  Human immunodeficiency virus status, CD4 count uncertain.  3.  Psoriasis.  4.  Depression.   PLAN:  The plan will be to admit the patient to the hospital at this time  for management of what is presumed to be a bacterial pneumonia. Will follow  ATS guidelines utilizing Zithromax and Rocephin. I note the patient's sodium  level was slightly decreased and will hydrate him with normal saline. The  rest his laboratory studies are unremarkable. Will take this opportunity a  recheck his CD4 count and tomorrow will contact Dr. Danella Deis office and  obtain his past records  As soon as his condition is clearly improving, will  switch to oral therapy.           ______________________________  Sherin Quarry, MD     SY/MEDQ  D:  03/25/2005  T:  03/26/2005  Job:  [id number]   cc:   Tresa Endo L. Philipp Deputy, M.D.  Fax: [phone]

## 2010-11-16 NOTE — H&P (Signed)
NAME:  David English NO.:  [id number]   MEDICAL RECORD NO.:  [id number]          PATIENT TYPE:  EMS   LOCATION:  ED                           FACILITY:  [facility]   PHYSICIAN:  Mobolaji B. Bakare, M.D.DATE OF BIRTH:  [birth year]   DATE OF ADMISSION:  07/11/2006  DATE OF DISCHARGE:                              HISTORY & PHYSICAL   PRIMARY CARE PHYSICIAN:  Dr. Philipp Deputy at [facility].   CHIEF COMPLAINT:  Cough and shortness of breath for 5 days.   HISTORY OF PRESENTING COMPLAINT:  Mr. David English is a 60 year old African  American male with history of HIV+ diagnosed in 2000.  The patient has  not been on any antiretroviral medication.  He stated he saw Dr. Philipp Deputy  last month, and some bloods were drawn.  He has yet to know the results.  He developed cough and progressive shortness of breath with 5 days ago.  The cough is occasionally productive of sputum.  There is no pleuritic  chest pain.  He has also experienced fever and chills.  He has been  getting progressively weaker.  Hence, he came to the emergency room.  His chest x-ray showed bilateral perihilar infiltrates suggestive of  pneumonia.  The patient has had blood cultures drawn, and he has  received Rocephin and Zithromax.   REVIEW OF SYSTEMS:  He denies chest pain, headaches, nausea, vomiting or  diarrhea.  There has not been any weight loss.  There is no hemoptysis.   PAST MEDICAL HISTORY:  1. HIV+, diagnosed in 2000.  2. Right upper lobe pneumonia in 2006.  3. Depression.  4. Psoriasis.   CURRENT MEDICATIONS:  1. Effexor 150 mg daily.  2. Xanax p.r.n.   ALLERGIES:  PENICILLIN.   FAMILY HISTORY:  His father died from pneumonia in 40.  His mother  died from heart disease in 57.   SOCIAL HISTORY:  The patient continues to smoke cigarettes.  He  occasionally drinks alcohol.  He is on disability.  He resides alone in  an apartment.   INITIAL VITAL SIGNS:  Temperature 100.8, blood pressure 125/81,  pulse  123, respiratory rate 20, O2 sat 94%.  Follow-up blood pressure is  106/66 with a pulse of 97 beats per minute.   PHYSICAL EXAMINATION:  GENERAL APPEARANCE:  The patient is slightly  tachypneic.  Oriented in time, place and person.  HEAD:  Normocephalic, atraumatic.  EENT:  Oropharynx with moist mucous membranes.  No thrush.  NECK:  No elevated JVD.  No carotid bruit.  LUNGS:  Bibasilar crackles.  No wheeze.  CARDIOVASCULAR SYSTEM:  S1, S2.  Regular.  No murmur.  No gallop.  ABDOMEN:  Nondistended, soft and nontender.  Bowel sounds present.  EXTREMITIES:  No pedal edema or calf tenderness.  SKIN:  No rash.  No petechiae.   INITIAL LABORATORY DATA:  Sodium 130, potassium 3.3, chloride 96, bicarb  24, glucose 126, BUN 32, creatinine 1.9, calcium 8.4.  Estimated GFR of  45.  White cells 13.5, hemoglobin 12.8, hematocrit 37.5, platelets 249,  absolute neutrophil count 11.9.   ASSESSMENT/PLAN:  1. Pneumonia, rule out  Pneumocystis jiroveci pneumonia.  We will check      ABG and LDH, send sputum for Gram's stain and culture and stain for      PCP.  We will continue Rocephin 1 gm IV daily and Zithromax 500 mg      IV daily.  2. Human immunodeficiency virus positive.  We will send blood for CD4      count and viral load.  The patient does not know his numbers.  I am      not sure if he is really compliant with followup.  3. Depression.  We will resume Effexor.  4. Hypokalemia.  We will supplement with potassium chloride 40 mEq      p.o. daily.  5. Acute renal insufficiency.  We will treat with normal saline.      Mobolaji B. Corky Downs, M.D.  Electronically Signed     MBB/MEDQ  D:  07/12/2006  T:  07/12/2006  Job:  [id number]   cc:   Tresa Endo L. Philipp Deputy, M.D.  Fax: [phone]

## 2010-12-04 ENCOUNTER — Telehealth: Payer: Self-pay | Admitting: *Deleted

## 2010-12-04 NOTE — Telephone Encounter (Signed)
States his vit B12 & multivit are not helping. Wants something else to be done. Has missed 2 OV with mc. Sent to front to make appt with Dr. Ninetta Lights. Mary to arrange. Aware there is not one until July. He has meds and refills until then. Went off on several tangents about housing authority & payee.

## 2010-12-05 ENCOUNTER — Encounter: Payer: Self-pay | Admitting: Infectious Diseases

## 2010-12-05 ENCOUNTER — Telehealth: Payer: Self-pay | Admitting: *Deleted

## 2010-12-05 ENCOUNTER — Ambulatory Visit (INDEPENDENT_AMBULATORY_CARE_PROVIDER_SITE_OTHER): Payer: Medicare Other | Admitting: Infectious Diseases

## 2010-12-05 DIAGNOSIS — A539 Syphilis, unspecified: Secondary | ICD-10-CM

## 2010-12-05 DIAGNOSIS — B20 Human immunodeficiency virus [HIV] disease: Secondary | ICD-10-CM

## 2010-12-05 DIAGNOSIS — Z79899 Other long term (current) drug therapy: Secondary | ICD-10-CM

## 2010-12-05 DIAGNOSIS — F39 Unspecified mood [affective] disorder: Secondary | ICD-10-CM

## 2010-12-05 DIAGNOSIS — Z113 Encounter for screening for infections with a predominantly sexual mode of transmission: Secondary | ICD-10-CM

## 2010-12-05 NOTE — Progress Notes (Signed)
  Subjective:    Patient ID: David English, male    DOB: 1951/05/19, 60 y.o.   MRN: [id number]  HPI 60 yo M with hx of HIV+ since 2000. He has been hospitalized in 2006 and 2008 (?) with pneumonia. He has been on ISN/TRV since 2011 . His CD4 is 160 and VL <20 (10-01-10).  (Previously CD4 was 200) Of note his Cr has increased over the past year 1.25 -> 1.58. His most recent chol was <200, Trig 217. Today has concerns that is energy level is low. He walks or has to take the bus whever he goes ([place]).  Has stress due to living in housing authority and having difficulty with his land lord.    Review of Systems  Constitutional: Negative for fever, chills and unexpected weight change.  Gastrointestinal: Negative for diarrhea and constipation.  Genitourinary: Negative for difficulty urinating.  Neurological: Negative for headaches.  Hematological: Does not bruise/bleed easily.       Objective:   Physical Exam  Constitutional: He appears well-developed and well-nourished.  HENT:  Mouth/Throat: Dental caries present.  Eyes: EOM are normal. Pupils are equal, round, and reactive to light.  Neck: Neck supple.  Cardiovascular: Normal rate, regular rhythm and normal heart sounds.   Pulmonary/Chest: Effort normal and breath sounds normal. No respiratory distress.  Abdominal: Soft. Bowel sounds are normal. There is no tenderness.  Lymphadenopathy:    He has no cervical adenopathy.          Assessment & Plan:

## 2010-12-05 NOTE — Telephone Encounter (Signed)
Referral made to [facility] Mola CMA

## 2010-12-05 NOTE — Assessment & Plan Note (Signed)
Will recheck his RPR at next lab

## 2010-12-05 NOTE — Assessment & Plan Note (Signed)
He had mild decrease in CD4. Will cont to watch. He is offered condoms. Will see him back in 4 months. Will try to get him involved with [facility] for case mgmt.

## 2010-12-05 NOTE — Assessment & Plan Note (Signed)
Will cont his anxiolytics.

## 2010-12-18 ENCOUNTER — Other Ambulatory Visit (INDEPENDENT_AMBULATORY_CARE_PROVIDER_SITE_OTHER): Payer: Medicare Other | Admitting: *Deleted

## 2010-12-18 DIAGNOSIS — M25579 Pain in unspecified ankle and joints of unspecified foot: Secondary | ICD-10-CM

## 2010-12-18 MED ORDER — GABAPENTIN 400 MG PO TABS: 400.0000 mg | ORAL_TABLET | Freq: Three times a day (TID) | ORAL | Status: DC

## 2011-02-14 ENCOUNTER — Other Ambulatory Visit: Payer: Self-pay | Admitting: *Deleted

## 2011-02-14 DIAGNOSIS — M25579 Pain in unspecified ankle and joints of unspecified foot: Secondary | ICD-10-CM

## 2011-02-14 MED ORDER — GABAPENTIN 400 MG PO TABS: 400.0000 mg | ORAL_TABLET | Freq: Three times a day (TID) | ORAL | Status: DC

## 2011-03-03 ENCOUNTER — Emergency Department (HOSPITAL_COMMUNITY): Payer: Medicare Other

## 2011-03-03 ENCOUNTER — Emergency Department (HOSPITAL_COMMUNITY)
Admission: EM | Admit: 2011-03-03 | Discharge: 2011-03-04 | Disposition: A | Discharge status: Home / Self Care | Payer: Medicare Other | Attending: Emergency Medicine | Admitting: Emergency Medicine

## 2011-03-03 DIAGNOSIS — IMO0002 Reserved for concepts with insufficient information to code with codable children: Secondary | ICD-10-CM | POA: Insufficient documentation

## 2011-03-03 DIAGNOSIS — F1910 Other psychoactive substance abuse, uncomplicated: Secondary | ICD-10-CM | POA: Insufficient documentation

## 2011-03-03 DIAGNOSIS — R4182 Altered mental status, unspecified: Secondary | ICD-10-CM | POA: Insufficient documentation

## 2011-03-03 DIAGNOSIS — R5381 Other malaise: Secondary | ICD-10-CM | POA: Insufficient documentation

## 2011-03-03 DIAGNOSIS — R059 Cough, unspecified: Secondary | ICD-10-CM | POA: Insufficient documentation

## 2011-03-03 DIAGNOSIS — R05 Cough: Secondary | ICD-10-CM | POA: Insufficient documentation

## 2011-03-03 DIAGNOSIS — Z21 Asymptomatic human immunodeficiency virus [HIV] infection status: Secondary | ICD-10-CM | POA: Insufficient documentation

## 2011-03-03 DIAGNOSIS — F411 Generalized anxiety disorder: Secondary | ICD-10-CM | POA: Insufficient documentation

## 2011-03-03 LAB — URINE MICROSCOPIC-ADD ON

## 2011-03-03 LAB — CBC
HCT: 48.6 % (ref 39.0–52.0)
Hemoglobin: 16.1 g/dL (ref 13.0–17.0)
MCH: 32 pg (ref 26.0–34.0)
MCHC: 33.1 g/dL (ref 30.0–36.0)
MCV: 96.6 fL (ref 78.0–100.0)
RDW: 13.5 % (ref 11.5–15.5)

## 2011-03-03 LAB — URINALYSIS, ROUTINE W REFLEX MICROSCOPIC
Glucose, UA: NEGATIVE mg/dL
Leukocytes, UA: NEGATIVE
Nitrite: NEGATIVE
Specific Gravity, Urine: 1.029 (ref 1.005–1.030)
pH: 5.5 (ref 5.0–8.0)

## 2011-03-03 LAB — COMPREHENSIVE METABOLIC PANEL
Albumin: 3.7 g/dL (ref 3.5–5.2)
BUN: 12 mg/dL (ref 6–23)
Chloride: 101 mEq/L (ref 96–112)
Creatinine, Ser: 1.8 mg/dL — ABNORMAL HIGH (ref 0.50–1.35)
GFR calc Af Amer: 47 mL/min — ABNORMAL LOW (ref >60)
GFR calc non Af Amer: 39 mL/min — ABNORMAL LOW (ref >60)
Glucose, Bld: 120 mg/dL — ABNORMAL HIGH (ref 70–99)
Total Bilirubin: 0.3 mg/dL (ref 0.3–1.2)

## 2011-03-03 LAB — SALICYLATE LEVEL: Salicylate Lvl: <2 mg/dL — ABNORMAL LOW (ref 2.8–20.0)

## 2011-03-03 LAB — DIFFERENTIAL
Basophils Absolute: 0 10*3/uL (ref 0.0–0.1)
Eosinophils Relative: 4 % (ref 0–5)
Lymphocytes Relative: 9 % — ABNORMAL LOW (ref 12–46)
Monocytes Absolute: 0.7 10*3/uL (ref 0.1–1.0)
Monocytes Relative: 10 % (ref 3–12)
Neutro Abs: 5.3 10*3/uL (ref 1.7–7.7)

## 2011-03-03 LAB — RAPID URINE DRUG SCREEN, HOSP PERFORMED: Benzodiazepines: POSITIVE — AB

## 2011-03-03 LAB — ETHANOL: Alcohol, Ethyl (B): <11 mg/dL (ref 0–11)

## 2011-03-27 ENCOUNTER — Other Ambulatory Visit: Payer: Self-pay | Admitting: *Deleted

## 2011-04-24 ENCOUNTER — Other Ambulatory Visit: Payer: Self-pay | Admitting: *Deleted

## 2011-04-24 DIAGNOSIS — F419 Anxiety disorder, unspecified: Secondary | ICD-10-CM

## 2011-04-24 MED ORDER — ALPRAZOLAM 2 MG PO TABS: 2.0000 mg | ORAL_TABLET | Freq: Three times a day (TID) | ORAL | Status: DC | PRN

## 2011-05-27 ENCOUNTER — Other Ambulatory Visit: Payer: Self-pay | Admitting: *Deleted

## 2011-05-27 DIAGNOSIS — M25579 Pain in unspecified ankle and joints of unspecified foot: Secondary | ICD-10-CM

## 2011-05-27 DIAGNOSIS — B20 Human immunodeficiency virus [HIV] disease: Secondary | ICD-10-CM

## 2011-05-27 MED ORDER — EMTRICITABINE-TENOFOVIR DF 200-300 MG PO TABS: 1.0000 | ORAL_TABLET | Freq: Every day | ORAL | Status: DC

## 2011-05-27 MED ORDER — RALTEGRAVIR POTASSIUM 400 MG PO TABS: 400.0000 mg | ORAL_TABLET | Freq: Two times a day (BID) | ORAL | Status: DC

## 2011-05-27 MED ORDER — GABAPENTIN 400 MG PO CAPS: 400.0000 mg | ORAL_CAPSULE | Freq: Three times a day (TID) | ORAL | Status: DC

## 2011-05-28 ENCOUNTER — Other Ambulatory Visit: Payer: Self-pay | Admitting: Infectious Diseases

## 2011-05-28 ENCOUNTER — Other Ambulatory Visit (INDEPENDENT_AMBULATORY_CARE_PROVIDER_SITE_OTHER): Payer: Medicare Other

## 2011-05-28 DIAGNOSIS — Z113 Encounter for screening for infections with a predominantly sexual mode of transmission: Secondary | ICD-10-CM

## 2011-05-28 DIAGNOSIS — Z79899 Other long term (current) drug therapy: Secondary | ICD-10-CM

## 2011-05-28 DIAGNOSIS — B20 Human immunodeficiency virus [HIV] disease: Secondary | ICD-10-CM

## 2011-05-28 DIAGNOSIS — A539 Syphilis, unspecified: Secondary | ICD-10-CM

## 2011-05-28 LAB — LIPID PANEL
HDL: 48 mg/dL (ref >39)
LDL Cholesterol: 72 mg/dL (ref 0–99)
Total CHOL/HDL Ratio: 3.6 Ratio
Triglycerides: 270 mg/dL — ABNORMAL HIGH (ref <150)

## 2011-05-28 LAB — COMPREHENSIVE METABOLIC PANEL
AST: 37 U/L (ref 0–37)
Alkaline Phosphatase: 122 U/L — ABNORMAL HIGH (ref 39–117)
BUN: 22 mg/dL (ref 6–23)
Calcium: 10 mg/dL (ref 8.4–10.5)
Creat: 1.63 mg/dL — ABNORMAL HIGH (ref 0.50–1.35)
Glucose, Bld: 90 mg/dL (ref 70–99)

## 2011-05-29 LAB — CBC
HCT: 46.6 % (ref 39.0–52.0)
MCHC: 32.4 g/dL (ref 30.0–36.0)
MCV: 97.5 fL (ref 78.0–100.0)
Platelets: 196 10*3/uL (ref 150–400)
RDW: 13.6 % (ref 11.5–15.5)
WBC: 6.2 10*3/uL (ref 4.0–10.5)

## 2011-05-29 LAB — T-HELPER CELL (CD4) - (RCID CLINIC ONLY): CD4 % Helper T Cell: 16 % — ABNORMAL LOW (ref 33–55)

## 2011-05-30 LAB — HIV-1 RNA QUANT-NO REFLEX-BLD: HIV-1 RNA Quant, Log: 1.48 {Log} — ABNORMAL HIGH (ref <1.30)

## 2011-06-13 ENCOUNTER — Ambulatory Visit (INDEPENDENT_AMBULATORY_CARE_PROVIDER_SITE_OTHER): Payer: Medicare Other | Admitting: Infectious Diseases

## 2011-06-13 ENCOUNTER — Encounter: Payer: Self-pay | Admitting: Infectious Diseases

## 2011-06-13 DIAGNOSIS — Z23 Encounter for immunization: Secondary | ICD-10-CM

## 2011-06-13 DIAGNOSIS — Z113 Encounter for screening for infections with a predominantly sexual mode of transmission: Secondary | ICD-10-CM

## 2011-06-13 DIAGNOSIS — Z720 Tobacco use: Secondary | ICD-10-CM | POA: Insufficient documentation

## 2011-06-13 DIAGNOSIS — I10 Essential (primary) hypertension: Secondary | ICD-10-CM | POA: Insufficient documentation

## 2011-06-13 DIAGNOSIS — N289 Disorder of kidney and ureter, unspecified: Secondary | ICD-10-CM

## 2011-06-13 DIAGNOSIS — Z79899 Other long term (current) drug therapy: Secondary | ICD-10-CM

## 2011-06-13 DIAGNOSIS — B20 Human immunodeficiency virus [HIV] disease: Secondary | ICD-10-CM

## 2011-06-13 DIAGNOSIS — F17200 Nicotine dependence, unspecified, uncomplicated: Secondary | ICD-10-CM

## 2011-06-13 NOTE — Assessment & Plan Note (Signed)
Will continue to watch, mildly improved.

## 2011-06-13 NOTE — Assessment & Plan Note (Addendum)
He is doing well, no problems with meds.  Flu and pnvx up to date. Needs PNVx in January 2013. Given condoms. Offered colonoscopy, refused.

## 2011-06-13 NOTE — Progress Notes (Signed)
Addended by: Alesia Morin F on: 06/13/2011 04:00 PM   Modules accepted: Orders

## 2011-06-13 NOTE — Assessment & Plan Note (Signed)
New dx, will f/u at next visit to see if still elevated.

## 2011-06-13 NOTE — Assessment & Plan Note (Signed)
Encouraged to quit. 

## 2011-06-13 NOTE — Progress Notes (Signed)
  Subjective:    Patient ID: David English, male    DOB: Oct 11, 1950, 60 y.o.   MRN: [id number]  HPI 60 yo M with hx of HIV+ since 2000. He has been hospitalized in 2006 and 2008 (?) with pneumonia. He has been on ISN/TRV since 2011 . Last CD4 230 and VL 30 (05-28-11) (Previously CD4 was 160) Of note his Cr has increased over the past year 1.25 -> 1.58 --> 1.8 --> 1.63. Gained 6# over past year.  Having some problems with muscle pains after moving his appt recently.    Review of Systems  Constitutional: Negative for appetite change and unexpected weight change.  Eyes: Negative for visual disturbance.  Respiratory: Negative for shortness of breath.   Gastrointestinal: Negative for diarrhea and constipation.  Genitourinary: Negative for dysuria.       Objective:   Physical Exam  Constitutional: He appears well-developed and well-nourished.  Eyes: EOM are normal. Pupils are equal, round, and reactive to light.  Neck: Neck supple.  Cardiovascular: Normal rate, regular rhythm and normal heart sounds.   Pulmonary/Chest: Effort normal and breath sounds normal.  Abdominal: Soft. Bowel sounds are normal. There is no tenderness. There is no rebound.  Lymphadenopathy:    He has no cervical adenopathy.  Psychiatric: His mood appears anxious. His speech is rapid and/or pressured.          Assessment & Plan:

## 2011-07-25 ENCOUNTER — Other Ambulatory Visit: Payer: Self-pay | Admitting: Licensed Clinical Social Worker

## 2011-07-25 ENCOUNTER — Other Ambulatory Visit: Payer: Self-pay | Admitting: *Deleted

## 2011-07-25 DIAGNOSIS — B20 Human immunodeficiency virus [HIV] disease: Secondary | ICD-10-CM

## 2011-07-25 DIAGNOSIS — M25579 Pain in unspecified ankle and joints of unspecified foot: Secondary | ICD-10-CM

## 2011-07-25 DIAGNOSIS — F419 Anxiety disorder, unspecified: Secondary | ICD-10-CM

## 2011-07-25 MED ORDER — GABAPENTIN 400 MG PO CAPS: 400.0000 mg | ORAL_CAPSULE | Freq: Three times a day (TID) | ORAL | Status: DC

## 2011-07-25 MED ORDER — EMTRICITABINE-TENOFOVIR DF 200-300 MG PO TABS: 1.0000 | ORAL_TABLET | Freq: Every day | ORAL | Status: DC

## 2011-07-25 MED ORDER — ALPRAZOLAM 2 MG PO TABS: 2.0000 mg | ORAL_TABLET | Freq: Three times a day (TID) | ORAL | Status: DC | PRN

## 2011-07-25 MED ORDER — RALTEGRAVIR POTASSIUM 400 MG PO TABS: 400.0000 mg | ORAL_TABLET | Freq: Two times a day (BID) | ORAL | Status: DC

## 2011-07-29 ENCOUNTER — Other Ambulatory Visit: Payer: Self-pay | Admitting: *Deleted

## 2011-07-29 DIAGNOSIS — M25579 Pain in unspecified ankle and joints of unspecified foot: Secondary | ICD-10-CM

## 2011-08-28 ENCOUNTER — Other Ambulatory Visit: Payer: Self-pay | Admitting: *Deleted

## 2011-08-28 DIAGNOSIS — F329 Major depressive disorder, single episode, unspecified: Secondary | ICD-10-CM

## 2011-08-28 DIAGNOSIS — M25579 Pain in unspecified ankle and joints of unspecified foot: Secondary | ICD-10-CM

## 2011-08-28 DIAGNOSIS — F39 Unspecified mood [affective] disorder: Secondary | ICD-10-CM

## 2011-08-28 MED ORDER — GABAPENTIN 400 MG PO CAPS: 400.0000 mg | ORAL_CAPSULE | Freq: Three times a day (TID) | ORAL | Status: DC

## 2011-08-28 MED ORDER — VENLAFAXINE HCL ER 150 MG PO CP24: 150.0000 mg | ORAL_CAPSULE | Freq: Every day | ORAL | Status: DC

## 2011-09-16 ENCOUNTER — Other Ambulatory Visit: Payer: Self-pay | Admitting: Infectious Diseases

## 2011-09-16 DIAGNOSIS — K0889 Other specified disorders of teeth and supporting structures: Secondary | ICD-10-CM

## 2011-09-16 MED ORDER — IBUPROFEN 200 MG PO TABS: 600.0000 mg | ORAL_TABLET | Freq: Four times a day (QID) | ORAL | Status: AC | PRN

## 2011-09-16 MED ORDER — IBUPROFEN 200 MG PO TABS
600.0000 mg | ORAL_TABLET | ORAL | Status: AC | PRN
  Administered 2011-09-16 (×2): 600 mg via ORAL

## 2011-10-02 ENCOUNTER — Other Ambulatory Visit: Payer: Self-pay | Admitting: *Deleted

## 2011-10-02 DIAGNOSIS — Z113 Encounter for screening for infections with a predominantly sexual mode of transmission: Secondary | ICD-10-CM

## 2011-10-25 ENCOUNTER — Other Ambulatory Visit: Payer: Self-pay | Admitting: Licensed Clinical Social Worker

## 2011-10-25 DIAGNOSIS — F419 Anxiety disorder, unspecified: Secondary | ICD-10-CM

## 2011-10-25 MED ORDER — ALPRAZOLAM 2 MG PO TABS: 2.0000 mg | ORAL_TABLET | Freq: Three times a day (TID) | ORAL | Status: DC | PRN

## 2011-11-20 ENCOUNTER — Other Ambulatory Visit: Payer: Medicare Other

## 2011-11-20 DIAGNOSIS — Z113 Encounter for screening for infections with a predominantly sexual mode of transmission: Secondary | ICD-10-CM

## 2011-11-20 DIAGNOSIS — Z79899 Other long term (current) drug therapy: Secondary | ICD-10-CM

## 2011-11-20 DIAGNOSIS — B20 Human immunodeficiency virus [HIV] disease: Secondary | ICD-10-CM

## 2011-11-21 LAB — CBC
Hemoglobin: 16 g/dL (ref 13.0–17.0)
MCH: 31.3 pg (ref 26.0–34.0)
Platelets: 236 10*3/uL (ref 150–400)
RBC: 5.11 MIL/uL (ref 4.22–5.81)
WBC: 4.9 10*3/uL (ref 4.0–10.5)

## 2011-11-21 LAB — COMPLETE METABOLIC PANEL WITH GFR
ALT: 48 U/L (ref 0–53)
Albumin: 4.2 g/dL (ref 3.5–5.2)
CO2: 27 mEq/L (ref 19–32)
Calcium: 9.9 mg/dL (ref 8.4–10.5)
Chloride: 100 mEq/L (ref 96–112)
GFR, Est African American: 53 mL/min — ABNORMAL LOW
GFR, Est Non African American: 46 mL/min — ABNORMAL LOW
Glucose, Bld: 96 mg/dL (ref 70–99)
Potassium: 4.3 mEq/L (ref 3.5–5.3)
Sodium: 139 mEq/L (ref 135–145)
Total Bilirubin: 0.4 mg/dL (ref 0.3–1.2)
Total Protein: 7.3 g/dL (ref 6.0–8.3)

## 2011-11-21 LAB — T.PALLIDUM AB, TOTAL: T pallidum Antibodies (TP-PA): >8 S/CO — ABNORMAL HIGH (ref <0.90)

## 2011-11-21 LAB — T-HELPER CELL (CD4) - (RCID CLINIC ONLY)
CD4 % Helper T Cell: 16 % — ABNORMAL LOW (ref 33–55)
CD4 T Cell Abs: 260 uL — ABNORMAL LOW (ref 400–2700)

## 2011-11-21 LAB — RPR: RPR Ser Ql: REACTIVE — AB

## 2011-11-21 LAB — LIPID PANEL: HDL: 54 mg/dL (ref >39)

## 2011-11-21 LAB — RPR TITER: RPR Titer: 1:1 {titer}

## 2011-11-25 LAB — HIV-1 RNA QUANT-NO REFLEX-BLD: HIV 1 RNA Quant: <20 copies/mL (ref <20)

## 2011-12-04 ENCOUNTER — Ambulatory Visit (INDEPENDENT_AMBULATORY_CARE_PROVIDER_SITE_OTHER): Payer: Medicare Other | Admitting: Infectious Diseases

## 2011-12-04 ENCOUNTER — Ambulatory Visit: Payer: Medicare Other | Admitting: Infectious Diseases

## 2011-12-04 ENCOUNTER — Encounter: Payer: Self-pay | Admitting: Infectious Diseases

## 2011-12-04 VITALS — BP 167/99 | HR 82 | Temp 97.4°F | Ht 71.5 in | Wt 216.0 lb

## 2011-12-04 DIAGNOSIS — N289 Disorder of kidney and ureter, unspecified: Secondary | ICD-10-CM

## 2011-12-04 DIAGNOSIS — F39 Unspecified mood [affective] disorder: Secondary | ICD-10-CM

## 2011-12-04 DIAGNOSIS — B20 Human immunodeficiency virus [HIV] disease: Secondary | ICD-10-CM

## 2011-12-04 DIAGNOSIS — I10 Essential (primary) hypertension: Secondary | ICD-10-CM

## 2011-12-04 NOTE — Assessment & Plan Note (Signed)
Continue to watch

## 2011-12-04 NOTE — Progress Notes (Signed)
Subjective:    Patient ID: David English, male    DOB: August 17, 1950, 61 y.o.   MRN: [id number]  HPI 61 yo M with hx of HIV+ since 2000. He has been hospitalized in 2006 and 2008 (?) with pneumonia. He has been on ISN/TRV since 2011 . Of note his Cr has increased: 1.25 -> 1.58 --> 1.8 --> 1.63 --> 1.61.   Lab Results  Component Value Date   CHOL 196 11/20/2011   HDL 54 11/20/2011   LDLCALC Comment:   Not calculated due to Triglyceride >400. Suggest ordering Direct LDL (Unit Code: 29528).   Total Cholesterol/HDL Ratio:CHD Risk                        Coronary Heart Disease Risk Table                                        Men       Women          1/2 Average Risk              3.4        3.3              Average Risk              5.0        4.4           2X Average Risk              9.6        7.1           3X Average Risk             23.4       11.0 Use the calculated Patient Ratio above and the CHD Risk table  to determine the patient's CHD Risk. ATP III Classification (LDL):       < 100        mg/dL         Optimal      413 - 129     mg/dL         Near or Above Optimal      130 - 159     mg/dL         Borderline High      160 - 189     mg/dL         High       > 244        mg/dL         Very High   0/03/2724   TRIG 427* 11/20/2011   CHOLHDL 3.6 11/20/2011    HIV 1 RNA Quant (copies/mL)  Date Value  11/20/2011 <20   05/28/2011 30*  10/01/2010 <20      CD4 T Cell Abs (cmm)  Date Value  11/20/2011 260*  05/28/2011 230*  10/01/2010 160*    Is on disability, states his main thing is financial stressors. Knows that his BP is too high, knows he needs to watch his diet.    Review of Systems     Objective:   Physical Exam  Constitutional: He appears well-developed and well-nourished.  Eyes: EOM are normal. Pupils are equal, round, and reactive to light.  Neck: Neck supple.  Cardiovascular: Normal rate, regular rhythm and normal heart sounds.  Pulmonary/Chest: Effort normal and breath sounds  normal.  Abdominal: Soft. Bowel sounds are normal. He exhibits no distension. There is no tenderness.  Lymphadenopathy:    He has no cervical adenopathy.          Assessment & Plan:

## 2011-12-04 NOTE — Assessment & Plan Note (Addendum)
Was previously written xanax and SSRI by Dr Drue Second. His speech is pressured and tangential, ? Bipolar. He needs psy eval.

## 2011-12-04 NOTE — Assessment & Plan Note (Signed)
Will cont to watch his BP. Hopefully improving his mental health will help improve this.

## 2011-12-04 NOTE — Assessment & Plan Note (Signed)
Will continue to watch, his CD4 and VL are good. His vax are up to date. Hep A Ab+.

## 2011-12-20 ENCOUNTER — Other Ambulatory Visit: Payer: Self-pay | Admitting: Infectious Diseases

## 2011-12-20 ENCOUNTER — Other Ambulatory Visit: Payer: Self-pay | Admitting: *Deleted

## 2011-12-20 DIAGNOSIS — F419 Anxiety disorder, unspecified: Secondary | ICD-10-CM

## 2011-12-20 DIAGNOSIS — F39 Unspecified mood [affective] disorder: Secondary | ICD-10-CM

## 2011-12-20 MED ORDER — ALPRAZOLAM 2 MG PO TABS: 2.0000 mg | ORAL_TABLET | Freq: Three times a day (TID) | ORAL | Status: DC | PRN

## 2012-01-30 ENCOUNTER — Telehealth: Payer: Self-pay | Admitting: *Deleted

## 2012-01-30 NOTE — Telephone Encounter (Signed)
Patient came into clinic with a B/P reading written down of 152/102, it was taken at [facility] on 01/15/12. He is not currently on B/P med.  His Medicaid card lists [facility] as his PCP.  His B/P today is 141/102. Advised patient to go to [facility] and request that he be assigned a new PCP as [facility] has closed. Will send this phone note to Dr. Ninetta Lights advising him of elevated B/P. David English CMA

## 2012-02-03 NOTE — Telephone Encounter (Signed)
thanks

## 2012-02-19 ENCOUNTER — Other Ambulatory Visit: Payer: Self-pay | Admitting: *Deleted

## 2012-02-19 DIAGNOSIS — F419 Anxiety disorder, unspecified: Secondary | ICD-10-CM

## 2012-02-19 MED ORDER — ALPRAZOLAM 2 MG PO TABS: 2.0000 mg | ORAL_TABLET | Freq: Three times a day (TID) | ORAL | Status: DC | PRN

## 2012-03-14 ENCOUNTER — Emergency Department (HOSPITAL_COMMUNITY): Payer: Medicare Other

## 2012-03-14 ENCOUNTER — Encounter (HOSPITAL_COMMUNITY): Payer: Self-pay | Admitting: *Deleted

## 2012-03-14 ENCOUNTER — Emergency Department (HOSPITAL_COMMUNITY)
Admission: EM | Admit: 2012-03-14 | Discharge: 2012-03-15 | Disposition: A | Discharge status: Home / Self Care | Payer: Medicare Other | Attending: Emergency Medicine | Admitting: Emergency Medicine

## 2012-03-14 DIAGNOSIS — Z21 Asymptomatic human immunodeficiency virus [HIV] infection status: Secondary | ICD-10-CM | POA: Insufficient documentation

## 2012-03-14 DIAGNOSIS — R4182 Altered mental status, unspecified: Secondary | ICD-10-CM | POA: Insufficient documentation

## 2012-03-14 DIAGNOSIS — F1910 Other psychoactive substance abuse, uncomplicated: Secondary | ICD-10-CM

## 2012-03-14 HISTORY — DX: Human immunodeficiency virus (HIV) disease: B20

## 2012-03-14 LAB — CBC WITH DIFFERENTIAL/PLATELET
Basophils Relative: 0 % (ref 0–1)
HCT: 47.9 % (ref 39.0–52.0)
Hemoglobin: 16.1 g/dL (ref 13.0–17.0)
Lymphs Abs: 1.6 10*3/uL (ref 0.7–4.0)
MCH: 32.3 pg (ref 26.0–34.0)
MCHC: 33.6 g/dL (ref 30.0–36.0)
Monocytes Absolute: 0.4 10*3/uL (ref 0.1–1.0)
Monocytes Relative: 6 % (ref 3–12)
Neutro Abs: 4.1 10*3/uL (ref 1.7–7.7)

## 2012-03-14 LAB — COMPREHENSIVE METABOLIC PANEL
ALT: 33 U/L (ref 0–53)
AST: 38 U/L — ABNORMAL HIGH (ref 0–37)
Calcium: 9.6 mg/dL (ref 8.4–10.5)
GFR calc Af Amer: 49 mL/min — ABNORMAL LOW (ref >90)
Sodium: 139 mEq/L (ref 135–145)
Total Protein: 7.8 g/dL (ref 6.0–8.3)

## 2012-03-14 LAB — URINALYSIS, ROUTINE W REFLEX MICROSCOPIC
Bilirubin Urine: NEGATIVE
Ketones, ur: NEGATIVE mg/dL
Nitrite: NEGATIVE
Protein, ur: NEGATIVE mg/dL
Specific Gravity, Urine: 1.011 (ref 1.005–1.030)
Urobilinogen, UA: 1 mg/dL (ref 0.0–1.0)

## 2012-03-14 LAB — RAPID URINE DRUG SCREEN, HOSP PERFORMED
Amphetamines: NOT DETECTED
Barbiturates: NOT DETECTED
Cocaine: POSITIVE — AB
Tetrahydrocannabinol: NOT DETECTED

## 2012-03-14 LAB — GLUCOSE, CAPILLARY: Glucose-Capillary: 105 mg/dL — ABNORMAL HIGH (ref 70–99)

## 2012-03-14 NOTE — ED Provider Notes (Signed)
History     CSN: [id number]  Arrival date & time 03/14/12  2108   First MD Initiated Contact with Patient 03/14/12 2228      Chief Complaint  Patient presents with  . Altered Mental Status    (Consider location/radiation/quality/duration/timing/severity/associated sxs/prior treatment) HPI Level V caveat. Altered mental status Patient found lying on the ground by a bystander. Patient uncooperative. Patient will not answer my questions. Is argumentative and mildly combative Past Medical History  Diagnosis Date  . HIV (human immunodeficiency virus infection)    Depression, psoriasis, pneumonia, mood disorder No past surgical history on file.  Family History  Problem Relation Age of Onset  . Heart disease Mother   . Pneumonia Father     History  Substance Use Topics  . Smoking status: Current Some Day Smoker -- 0.3 packs/day for 30 years    Types: Cigarettes  . Smokeless tobacco: Never Used  . Alcohol Use: 3.0 oz/week    6 drink(s) per week     vodka      Review of Systems  Unable to perform ROS   Allergies  Penicillins  Home Medications   Current Outpatient Rx  Name Route Sig Dispense Refill  . ALPRAZOLAM 2 MG PO TABS Oral Take 1 tablet (2 mg total) by mouth 3 (three) times daily as needed. 90 tablet 0  . EMTRICITABINE-TENOFOVIR 200-300 MG PO TABS Oral Take 1 tablet by mouth daily. 30 tablet 6  . GABAPENTIN 400 MG PO CAPS  TAKE 1 CAPSULE (400 MG TOTAL) BY MOUTH THREE   (THREE) TIMES DAILY. 90 capsule 1  . RALTEGRAVIR POTASSIUM 400 MG PO TABS Oral Take 1 tablet (400 mg total) by mouth 2 (two) times daily. 60 tablet 6  . VENLAFAXINE HCL ER 150 MG PO CP24  TAKE ONE CAPSULE BY MOUTH DAILY 90 capsule 1    BP 136/83  Pulse 80  Temp 98 F (36.7 C) (Oral)  Resp 16  SpO2 98%  Physical Exam  Nursing note and vitals reviewed. Constitutional: He appears well-developed and well-nourished.       unkempt  HENT:  Head: Normocephalic and atraumatic.  Right Ear:  External ear normal.  Left Ear: External ear normal.       No facial asymmetry poor dentition  Eyes: Conjunctivae normal are normal. Pupils are equal, round, and reactive to light.  Neck: Neck supple. No tracheal deviation present. No thyromegaly present.  Cardiovascular: Normal rate and regular rhythm.   No murmur heard. Pulmonary/Chest: Effort normal and breath sounds normal.  Abdominal: Soft. Bowel sounds are normal. He exhibits no distension. There is no tenderness.  Musculoskeletal: Normal range of motion. He exhibits no edema and no tenderness.       Moves all extremities motor strength grossly 5 over 5 overall  Neurological: He is alert. He displays normal reflexes. Coordination normal.  Skin: Skin is warm and dry. No rash noted.  Psychiatric:       Argumentative and uncooperative    ED Course  Procedures (including critical care time)  Date: 03/14/2012  Rate: 85  Rhythm: normal sinus rhythm  QRS Axis: normal  Intervals: normal  ST/T Wave abnormalities: nonspecific T wave changes  Conduction Disutrbances:none  Narrative Interpretation:   Old EKG Reviewed: No significant change over 07/17/2006 as interpreted by me  Labs Reviewed  GLUCOSE, CAPILLARY - Abnormal; Notable for the following:    Glucose-Capillary 105 (*)     All other components within normal limits   No results  found.   No diagnosis found. Results for orders placed during the hospital encounter of 03/14/12  GLUCOSE, CAPILLARY      Component Value Range   Glucose-Capillary 105 (*) 70 - 99 mg/dL  COMPREHENSIVE METABOLIC PANEL      Component Value Range   Sodium 139  135 - 145 mEq/L   Potassium 4.1  3.5 - 5.1 mEq/L   Chloride 98  96 - 112 mEq/L   CO2 30  19 - 32 mEq/L   Glucose, Bld 112 (*) 70 - 99 mg/dL   BUN 15  6 - 23 mg/dL   Creatinine, Ser 0.45 (*) 0.50 - 1.35 mg/dL   Calcium 9.6  8.4 - 40.9 mg/dL   Total Protein 7.8  6.0 - 8.3 g/dL   Albumin 3.9  3.5 - 5.2 g/dL   AST 38 (*) 0 - 37 U/L   ALT  33  0 - 53 U/L   Alkaline Phosphatase 151 (*) 39 - 117 U/L   Total Bilirubin 0.2 (*) 0.3 - 1.2 mg/dL   GFR calc non Af Amer 42 (*) >90 mL/min   GFR calc Af Amer 49 (*) >90 mL/min  CBC WITH DIFFERENTIAL      Component Value Range   WBC 6.1  4.0 - 10.5 K/uL   RBC 4.99  4.22 - 5.81 MIL/uL   Hemoglobin 16.1  13.0 - 17.0 g/dL   HCT 81.1  91.4 - 78.2 %   MCV 96.0  78.0 - 100.0 fL   MCH 32.3  26.0 - 34.0 pg   MCHC 33.6  30.0 - 36.0 g/dL   RDW 95.6  21.3 - 08.6 %   Platelets 166  150 - 400 K/uL   Neutrophils Relative 67  43 - 77 %   Neutro Abs 4.1  1.7 - 7.7 K/uL   Lymphocytes Relative 26  12 - 46 %   Lymphs Abs 1.6  0.7 - 4.0 K/uL   Monocytes Relative 6  3 - 12 %   Monocytes Absolute 0.4  0.1 - 1.0 K/uL   Eosinophils Relative 2  0 - 5 %   Eosinophils Absolute 0.1  0.0 - 0.7 K/uL   Basophils Relative 0  0 - 1 %   Basophils Absolute 0.0  0.0 - 0.1 K/uL  ETHANOL      Component Value Range   Alcohol, Ethyl (B) 212 (*) 0 - 11 mg/dL  URINE RAPID DRUG SCREEN (HOSP PERFORMED)      Component Value Range   Opiates NONE DETECTED  NONE DETECTED   Cocaine POSITIVE (*) NONE DETECTED   Benzodiazepines POSITIVE (*) NONE DETECTED   Amphetamines NONE DETECTED  NONE DETECTED   Tetrahydrocannabinol NONE DETECTED  NONE DETECTED   Barbiturates NONE DETECTED  NONE DETECTED  URINALYSIS, ROUTINE W REFLEX MICROSCOPIC      Component Value Range   Color, Urine YELLOW  YELLOW   APPearance CLEAR  CLEAR   Specific Gravity, Urine 1.011  1.005 - 1.030   pH 6.0  5.0 - 8.0   Glucose, UA NEGATIVE  NEGATIVE mg/dL   Hgb urine dipstick NEGATIVE  NEGATIVE   Bilirubin Urine NEGATIVE  NEGATIVE   Ketones, ur NEGATIVE  NEGATIVE mg/dL   Protein, ur NEGATIVE  NEGATIVE mg/dL   Urobilinogen, UA 1.0  0.0 - 1.0 mg/dL   Nitrite NEGATIVE  NEGATIVE   Leukocytes, UA NEGATIVE  NEGATIVE   Ct Head Wo Contrast  03/14/2012  *RADIOLOGY REPORT*  Clinical Data: Altered mental status.  CT HEAD WITHOUT CONTRAST  Technique:   Contiguous axial images were obtained from the base of the skull through the vertex without contrast.  Comparison: 02/04/2010  Findings: Mild chronic small vessel white matter ischemic changes again noted.  No acute intracranial abnormalities are identified, including mass lesion or mass effect, hydrocephalus, extra-axial fluid collection, midline shift, hemorrhage, or acute infarction.  The visualized bony calvarium is unremarkable.  IMPRESSION: No evidence of acute intracranial abnormality.  Mild chronic small vessel white matter ischemic changes.   Original Report Authenticated By: Rosendo Gros, M.D.       MDM  Pt signed out to Dr Norlene Campbell 1235 am  Dx Polysubstance abuse        Doug Sou, MD 03/15/12 1610

## 2012-03-14 NOTE — ED Notes (Signed)
[place] [facility] [place]. Pt found lying on ground by bystander. Per EMS pt stated he was walking home and he laid down on the ground. EMS reports smells like ETOH, slurred speech, left sided facial droop, but left sided teeth missing (Teeth missing not acute). Grip strength equal bilaterally, but unable to ambulate. PT came in with Gatorade bottle with contents that smell like ETOH. 18 ga left AC.

## 2012-03-15 ENCOUNTER — Encounter (HOSPITAL_COMMUNITY): Payer: Self-pay | Admitting: *Deleted

## 2012-03-15 NOTE — ED Notes (Signed)
Pt states understanding of discharge instructions 

## 2012-03-15 NOTE — ED Provider Notes (Signed)
Care received from Dr. Rennis Chris. Patient with alcohol intoxication, altered mental status. Family positive for cocaine and benzodiazepines. CT head negative. Patient has slept the night, now wishing to go home. We'll discharge to followup with his primary care Dr.  Results for orders placed during the hospital encounter of 03/14/12  GLUCOSE, CAPILLARY      Component Value Range   Glucose-Capillary 105 (*) 70 - 99 mg/dL  COMPREHENSIVE METABOLIC PANEL      Component Value Range   Sodium 139  135 - 145 mEq/L   Potassium 4.1  3.5 - 5.1 mEq/L   Chloride 98  96 - 112 mEq/L   CO2 30  19 - 32 mEq/L   Glucose, Bld 112 (*) 70 - 99 mg/dL   BUN 15  6 - 23 mg/dL   Creatinine, Ser 9.56 (*) 0.50 - 1.35 mg/dL   Calcium 9.6  8.4 - 21.3 mg/dL   Total Protein 7.8  6.0 - 8.3 g/dL   Albumin 3.9  3.5 - 5.2 g/dL   AST 38 (*) 0 - 37 U/L   ALT 33  0 - 53 U/L   Alkaline Phosphatase 151 (*) 39 - 117 U/L   Total Bilirubin 0.2 (*) 0.3 - 1.2 mg/dL   GFR calc non Af Amer 42 (*) >90 mL/min   GFR calc Af Amer 49 (*) >90 mL/min  CBC WITH DIFFERENTIAL      Component Value Range   WBC 6.1  4.0 - 10.5 K/uL   RBC 4.99  4.22 - 5.81 MIL/uL   Hemoglobin 16.1  13.0 - 17.0 g/dL   HCT 08.6  57.8 - 46.9 %   MCV 96.0  78.0 - 100.0 fL   MCH 32.3  26.0 - 34.0 pg   MCHC 33.6  30.0 - 36.0 g/dL   RDW 62.9  52.8 - 41.3 %   Platelets 166  150 - 400 K/uL   Neutrophils Relative 67  43 - 77 %   Neutro Abs 4.1  1.7 - 7.7 K/uL   Lymphocytes Relative 26  12 - 46 %   Lymphs Abs 1.6  0.7 - 4.0 K/uL   Monocytes Relative 6  3 - 12 %   Monocytes Absolute 0.4  0.1 - 1.0 K/uL   Eosinophils Relative 2  0 - 5 %   Eosinophils Absolute 0.1  0.0 - 0.7 K/uL   Basophils Relative 0  0 - 1 %   Basophils Absolute 0.0  0.0 - 0.1 K/uL  ETHANOL      Component Value Range   Alcohol, Ethyl (B) 212 (*) 0 - 11 mg/dL  URINE RAPID DRUG SCREEN (HOSP PERFORMED)      Component Value Range   Opiates NONE DETECTED  NONE DETECTED   Cocaine POSITIVE (*)  NONE DETECTED   Benzodiazepines POSITIVE (*) NONE DETECTED   Amphetamines NONE DETECTED  NONE DETECTED   Tetrahydrocannabinol NONE DETECTED  NONE DETECTED   Barbiturates NONE DETECTED  NONE DETECTED  URINALYSIS, ROUTINE W REFLEX MICROSCOPIC      Component Value Range   Color, Urine YELLOW  YELLOW   APPearance CLEAR  CLEAR   Specific Gravity, Urine 1.011  1.005 - 1.030   pH 6.0  5.0 - 8.0   Glucose, UA NEGATIVE  NEGATIVE mg/dL   Hgb urine dipstick NEGATIVE  NEGATIVE   Bilirubin Urine NEGATIVE  NEGATIVE   Ketones, ur NEGATIVE  NEGATIVE mg/dL   Protein, ur NEGATIVE  NEGATIVE mg/dL   Urobilinogen, UA 1.0  0.0 - 1.0 mg/dL   Nitrite NEGATIVE  NEGATIVE   Leukocytes, UA NEGATIVE  NEGATIVE   Ct Head Wo Contrast  03/14/2012  *RADIOLOGY REPORT*  Clinical Data: Altered mental status.  CT HEAD WITHOUT CONTRAST  Technique:  Contiguous axial images were obtained from the base of the skull through the vertex without contrast.  Comparison: 02/04/2010  Findings: Mild chronic small vessel white matter ischemic changes again noted.  No acute intracranial abnormalities are identified, including mass lesion or mass effect, hydrocephalus, extra-axial fluid collection, midline shift, hemorrhage, or acute infarction.  The visualized bony calvarium is unremarkable.  IMPRESSION: No evidence of acute intracranial abnormality.  Mild chronic small vessel white matter ischemic changes.   Original Report Authenticated By: Rosendo Gros, M.D.       Olivia Mackie, MD 03/15/12 [phone]

## 2012-03-20 ENCOUNTER — Other Ambulatory Visit: Payer: Self-pay | Admitting: Infectious Diseases

## 2012-03-20 DIAGNOSIS — F419 Anxiety disorder, unspecified: Secondary | ICD-10-CM

## 2012-04-15 ENCOUNTER — Other Ambulatory Visit: Payer: Self-pay | Admitting: Infectious Diseases

## 2012-04-15 ENCOUNTER — Other Ambulatory Visit: Payer: Self-pay | Admitting: *Deleted

## 2012-05-09 IMAGING — CT CT HEAD W/O CM
5 of 8 series · 16 of 47 positions shown, 17 images · non-contrast
Comparison: None.

CT HEAD

CLINICAL DATA: 58-year-old male status post fall down steps.  Pain
and head injury.

CT HEAD WITHOUT CONTRAST
CT CERVICAL SPINE WITHOUT CONTRAST
TECHNIQUE: Multidetector CT imaging of the head and cervical spine
was performed following the standard protocol without intravenous
contrast.  Multiplanar CT image reconstructions of the cervical
spine were also generated.

[Series 2: head trauma 4.8 h37s · axial · 0.48mm/px · z∈[-153,-48]mm · 4 of 36 slices shown, 5 images]
[im 8/36  brain]
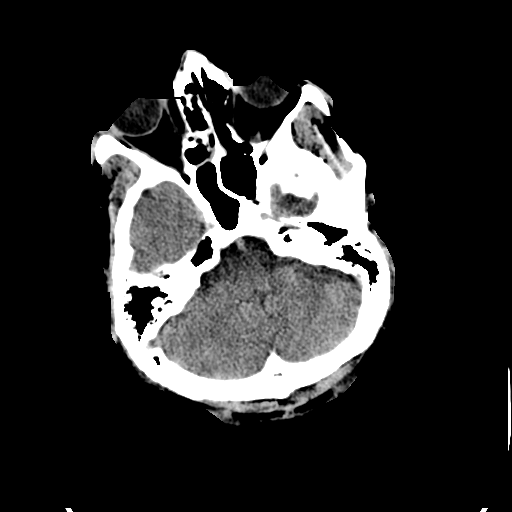
[im 8/36  bone]
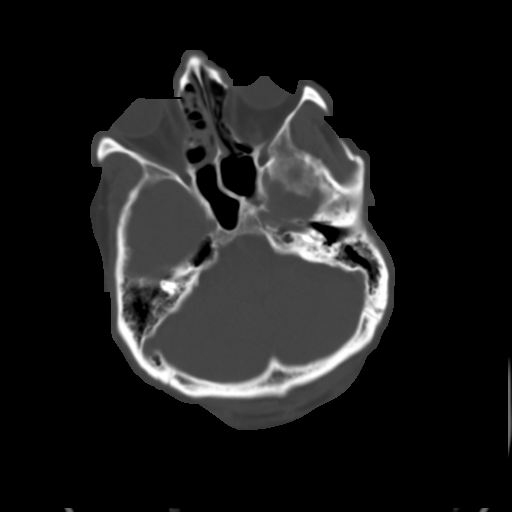
[im 15/36  brain]
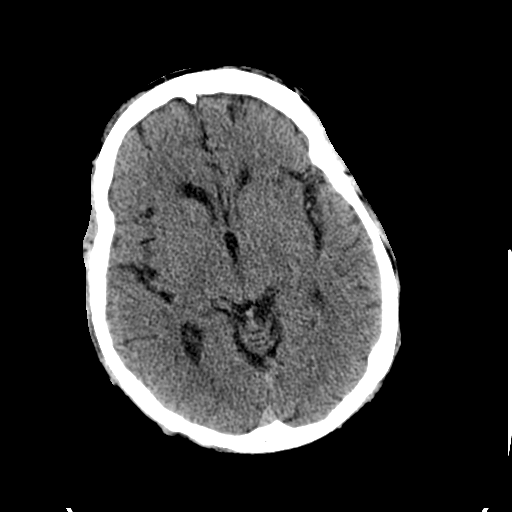
[im 22/36  brain]
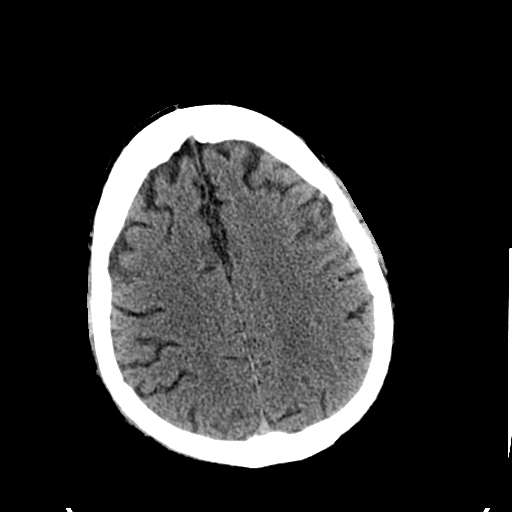
[im 29/36  brain]
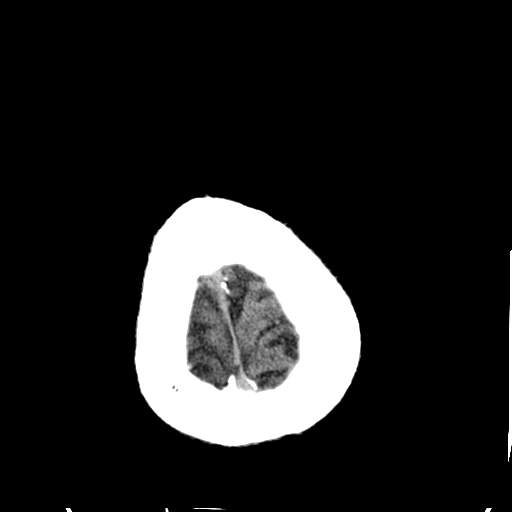

[Series 604: axial · axial · 0.23mm/px · z∈[-290,-251]mm · 3 of 30 slices shown (1 of 2)]
[im 8/30  brain]
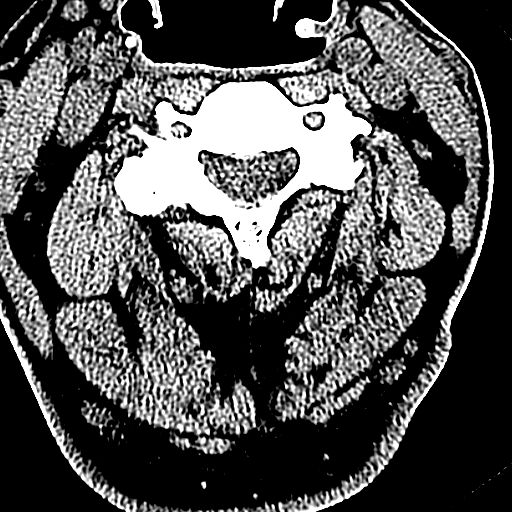
[im 15/30  brain]
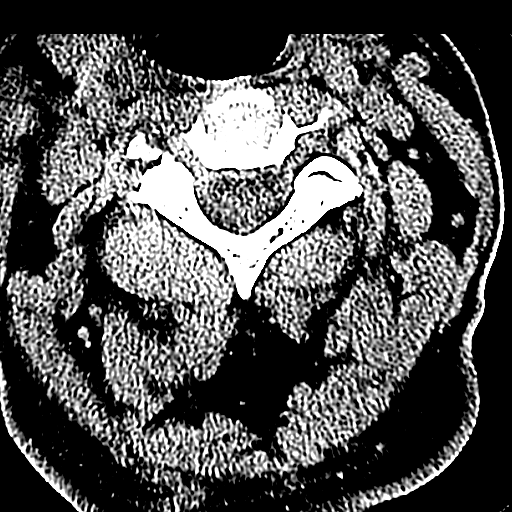
[im 22/30  brain]
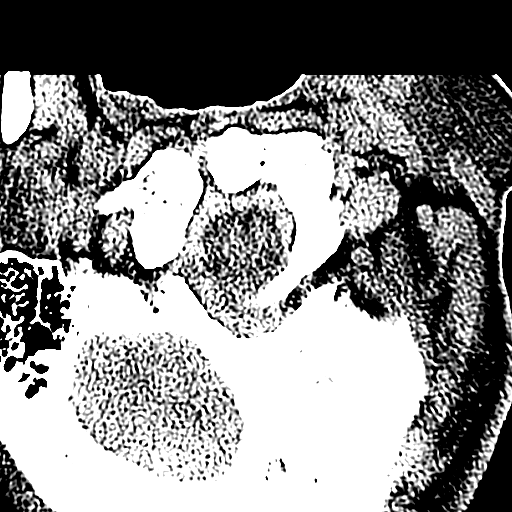

[Series 605: axial · axial · 0.26mm/px · z∈[-365,-330]mm · 3 of 30 slices shown (2 of 2)]
[im 8/30  brain]
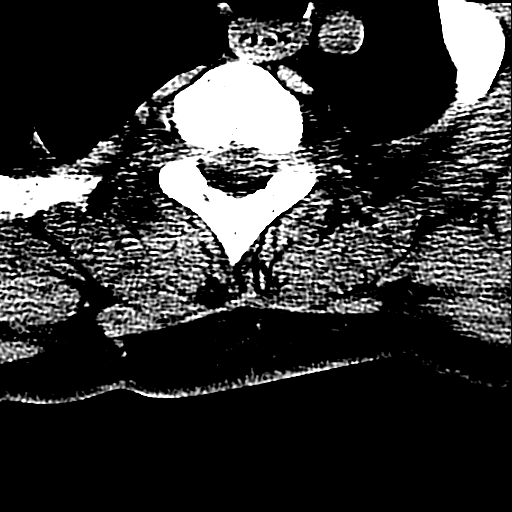
[im 15/30  brain]
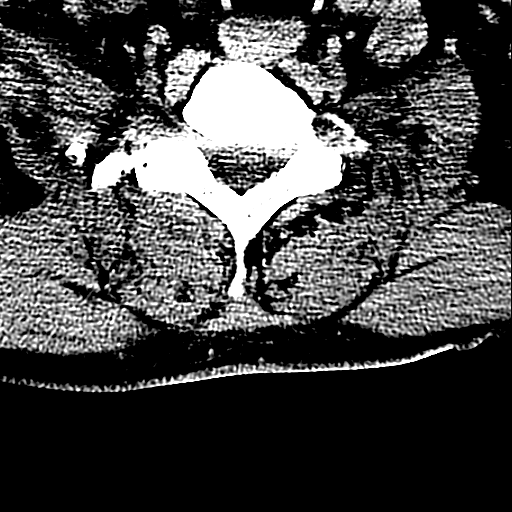
[im 22/30  brain]
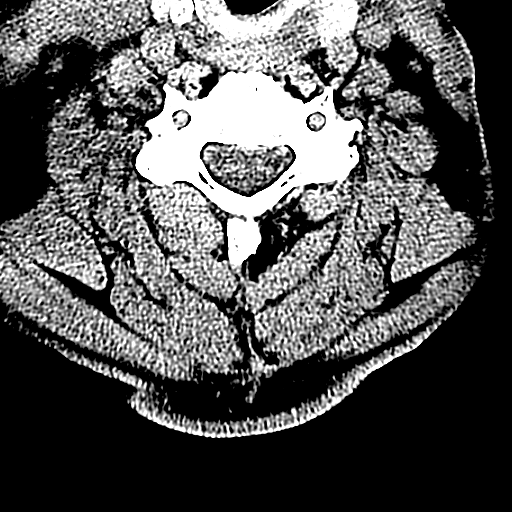

[Series 607: sag · sagittal · 0.44mm/px · 3 of 73 slices shown]
[im 19/73  brain]
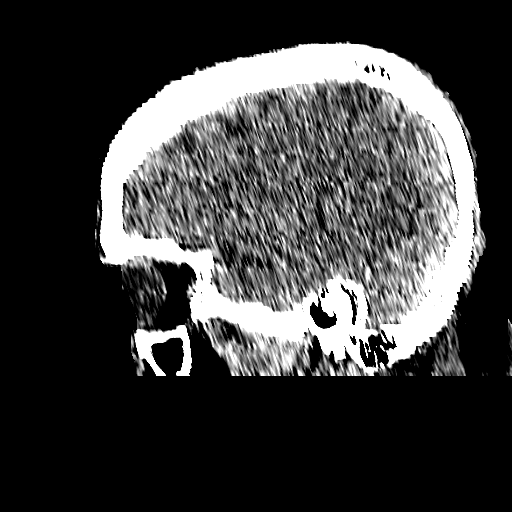
[im 37/73  brain]
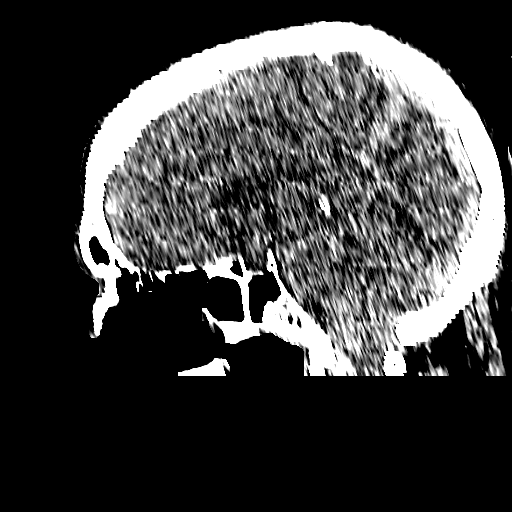
[im 55/73  brain]
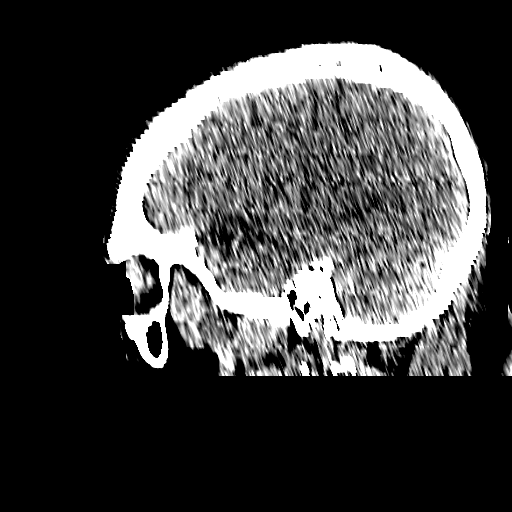

[Series 609: coronal · coronal · 0.34mm/px · 3 of 61 slices shown]
[im 23/61  brain]
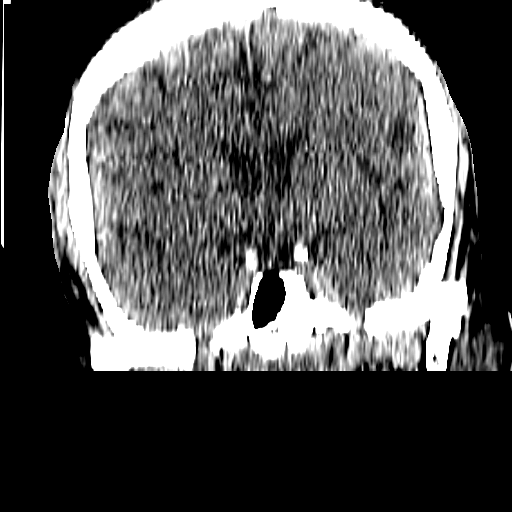
[im 31/61  brain]
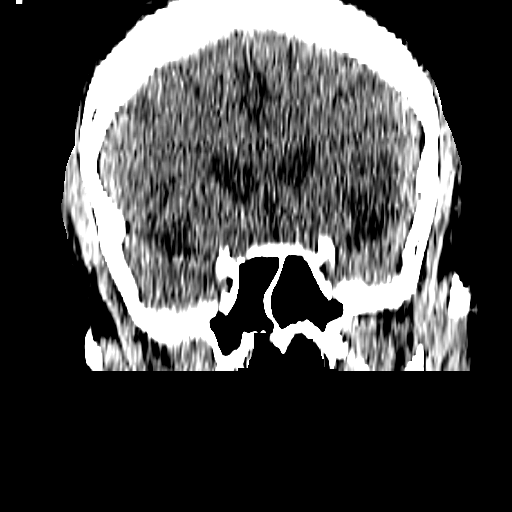
[im 38/61  brain]
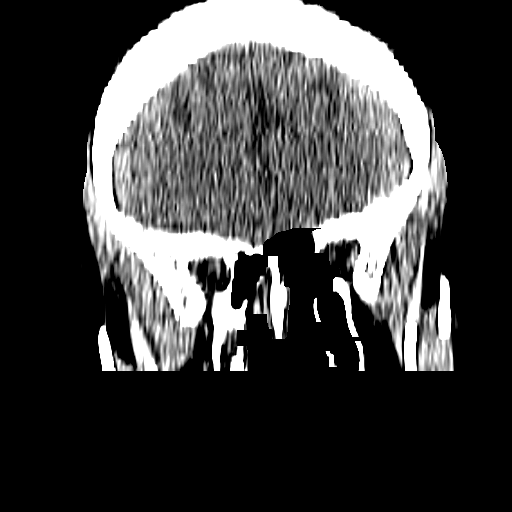

[16 of 47 positions shown; findings below may reference images not displayed]

FINDINGS: Right supraorbital scalp laceration and small scalp
superficial hematoma (5 mm in thickness). There is a chronic left
lamina papyracea fracture.  There is an acute right lamina
papyracea fracture.  There is mild extraconal contusion in the
medial right orbit associated.  The right globe is intact.  There
is a comminuted left orbital floor fracture with no herniated
intraorbital structures identified.  There is some blood and
secretions layering in the right maxillary sinus.  The visualized
maxilla otherwise appears intact.  There is a chronic left
posterior zygomatic arch fracture.

Remaining paranasal sinuses and mastoids are clear.  Calvarium is
intact.  Mild Calcified atherosclerosis at the skull base.

Cerebral volume is within normal limits for age.  No midline shift,
ventriculomegaly, mass effect, evidence of mass lesion,
intracranial hemorrhage or evidence of cortically based acute
infarction.  Gray-white matter differentiation is within normal
limits throughout the brain.
IMPRESSION: 1.  Acute facial fractures:
- Right lamina papyracea fracture.
- Right orbital floor fracture.
Chronic left facial fractures also noted as above.
Unless there is suspicion of a mandible fracture, I do not think
the patient requires a dedicated facial CT scan.
2. Normal CT appearance of the brain.
3.  Cervical findings are below.

CT CERVICAL SPINE
FINDINGS: Multilevel moderate and severe cervical facet
degeneration and hypertrophy.  Lung apices are clear. Visualized
paraspinal soft tissues are within normal limits.  Relatively
preserved cervical lordosis. Visualized skull base is intact.  No
atlanto-occipital dissociation.  Cervicothoracic junction alignment
is within normal limits.  Bilateral posterior element alignment is
within normal limits.  Severe chronic disc degeneration at C6-C7.
Lesser disc degeneration at C5-C6.  Multilevel cervical neural
foraminal stenosis.  No acute cervical fracture.  No definite
cervical spinal stenosis.
IMPRESSION: 1. No acute fracture or listhesis identified in the cervical spine.
Ligamentous injury is not excluded.
2.  Multilevel disc and facet degeneration.

## 2012-05-13 ENCOUNTER — Other Ambulatory Visit: Payer: Self-pay | Admitting: Infectious Diseases

## 2012-05-21 ENCOUNTER — Other Ambulatory Visit (INDEPENDENT_AMBULATORY_CARE_PROVIDER_SITE_OTHER): Payer: Medicare Other

## 2012-05-21 DIAGNOSIS — B20 Human immunodeficiency virus [HIV] disease: Secondary | ICD-10-CM

## 2012-05-21 NOTE — Addendum Note (Signed)
Addended by: Mariea Clonts D on: 05/21/2012 03:28 PM   Modules accepted: Orders

## 2012-05-22 LAB — CBC
HCT: 43.9 % (ref 39.0–52.0)
Hemoglobin: 15.1 g/dL (ref 13.0–17.0)
MCH: 31.7 pg (ref 26.0–34.0)
MCHC: 34.4 g/dL (ref 30.0–36.0)
MCV: 92 fL (ref 78.0–100.0)
RDW: 13.3 % (ref 11.5–15.5)

## 2012-05-22 LAB — COMPREHENSIVE METABOLIC PANEL
AST: 24 U/L (ref 0–37)
Alkaline Phosphatase: 106 U/L (ref 39–117)
BUN: 13 mg/dL (ref 6–23)
Creat: 1.5 mg/dL — ABNORMAL HIGH (ref 0.50–1.35)
Glucose, Bld: 80 mg/dL (ref 70–99)
Total Bilirubin: 0.3 mg/dL (ref 0.3–1.2)

## 2012-05-22 LAB — T-HELPER CELL (CD4) - (RCID CLINIC ONLY): CD4 % Helper T Cell: 15 % — ABNORMAL LOW (ref 33–55)

## 2012-05-25 LAB — HIV-1 RNA QUANT-NO REFLEX-BLD: HIV-1 RNA Quant, Log: <1.3 {Log} (ref <1.30)

## 2012-06-04 ENCOUNTER — Ambulatory Visit (INDEPENDENT_AMBULATORY_CARE_PROVIDER_SITE_OTHER): Payer: Medicare Other | Admitting: Infectious Diseases

## 2012-06-04 ENCOUNTER — Encounter: Payer: Self-pay | Admitting: Infectious Diseases

## 2012-06-04 VITALS — BP 124/74 | HR 123 | Temp 98.4°F | Ht 71.5 in | Wt 220.0 lb

## 2012-06-04 DIAGNOSIS — Z720 Tobacco use: Secondary | ICD-10-CM

## 2012-06-04 DIAGNOSIS — Z113 Encounter for screening for infections with a predominantly sexual mode of transmission: Secondary | ICD-10-CM

## 2012-06-04 DIAGNOSIS — B20 Human immunodeficiency virus [HIV] disease: Secondary | ICD-10-CM

## 2012-06-04 DIAGNOSIS — N289 Disorder of kidney and ureter, unspecified: Secondary | ICD-10-CM

## 2012-06-04 DIAGNOSIS — F17200 Nicotine dependence, unspecified, uncomplicated: Secondary | ICD-10-CM

## 2012-06-04 DIAGNOSIS — Z79899 Other long term (current) drug therapy: Secondary | ICD-10-CM

## 2012-06-04 DIAGNOSIS — F39 Unspecified mood [affective] disorder: Secondary | ICD-10-CM

## 2012-06-04 NOTE — Assessment & Plan Note (Signed)
Will continue to watch his Cr. Has improved.

## 2012-06-04 NOTE — Assessment & Plan Note (Signed)
Appreciate f/u from [facility] center

## 2012-06-04 NOTE — Progress Notes (Signed)
  Subjective:    Patient ID: David English, male    DOB: 09-06-1950, 61 y.o.   MRN: [id number]  HPI 61 yo M with hx of HIV+ since 2000. He has been hospitalized in 2006 and 2008 (?) with pneumonia. He has been on ISN/TRV since 2011 . Of note his Cr has increased: 1.25 -> 1.58 --> 1.8 --> 1.63 --> 1.61--> 1.7 and now 1.5. 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  Has been seen at [facility] center. Was taking abilify, xanax, effexor, neurontin but since abilify has been changed to Risperidone.  Has had high BP at [facility] center but believes since changing to risperidone his BP is better.     HIV 1 RNA Quant (copies/mL)  Date Value  05/21/2012 <20   11/20/2011 <20   05/28/2011 30*     CD4 T Cell Abs (cmm)  Date Value  05/21/2012 360*  11/20/2011 260*  05/28/2011 230*    Review of Systems  Constitutional: Negative for appetite change and unexpected weight change.  Gastrointestinal: Negative for diarrhea and constipation.  Genitourinary: Negative for difficulty urinating.  Psychiatric/Behavioral: Negative for dysphoric mood.       Objective:   Physical Exam  Constitutional: He appears well-developed and well-nourished.  HENT:  Mouth/Throat: No oropharyngeal exudate.  Eyes: EOM are normal. Pupils are equal, round, and reactive to light.  Neck: Neck supple.  Cardiovascular: Normal rate, regular rhythm and normal heart sounds.   Pulmonary/Chest: Effort normal and breath sounds normal.  Abdominal: Soft. Bowel sounds are normal. There is no tenderness.  Musculoskeletal: He exhibits no edema.  Lymphadenopathy:    He has no cervical adenopathy.  Psychiatric: He has a normal mood and affect. His behavior is normal.          Assessment & Plan:

## 2012-06-04 NOTE — Assessment & Plan Note (Signed)
Encouraged to quit. 

## 2012-06-04 NOTE — Assessment & Plan Note (Signed)
He's doing very well. His #s are explained to him. He is offered/given condoms. Will see him back in 6 months. vax are up to date.

## 2012-06-15 ENCOUNTER — Other Ambulatory Visit: Payer: Self-pay | Admitting: Infectious Diseases

## 2012-06-17 ENCOUNTER — Other Ambulatory Visit: Payer: Self-pay | Admitting: *Deleted

## 2012-06-17 DIAGNOSIS — F419 Anxiety disorder, unspecified: Secondary | ICD-10-CM

## 2012-06-17 MED ORDER — ALPRAZOLAM 2 MG PO TABS: 2.0000 mg | ORAL_TABLET | Freq: Three times a day (TID) | ORAL | Status: DC | PRN

## 2012-07-07 ENCOUNTER — Telehealth: Payer: Self-pay | Admitting: *Deleted

## 2012-07-07 NOTE — Telephone Encounter (Signed)
Patient called to inquire about how to get in touch with the dental clinic. Advised him they are a traveling clinic and I can give [facility] his information and they will call him to schedule and discuss any issues he has.

## 2012-07-15 ENCOUNTER — Other Ambulatory Visit: Payer: Self-pay | Admitting: Infectious Diseases

## 2012-07-15 ENCOUNTER — Other Ambulatory Visit: Payer: Self-pay | Admitting: *Deleted

## 2012-07-15 DIAGNOSIS — F419 Anxiety disorder, unspecified: Secondary | ICD-10-CM

## 2012-07-15 MED ORDER — ALPRAZOLAM 2 MG PO TABS: 2.0000 mg | ORAL_TABLET | Freq: Three times a day (TID) | ORAL | Status: DC | PRN

## 2012-08-11 ENCOUNTER — Other Ambulatory Visit: Payer: Self-pay | Admitting: Infectious Diseases

## 2012-08-12 ENCOUNTER — Other Ambulatory Visit: Payer: Self-pay | Admitting: Infectious Diseases

## 2012-08-12 ENCOUNTER — Telehealth: Payer: Self-pay | Admitting: *Deleted

## 2012-08-12 DIAGNOSIS — F419 Anxiety disorder, unspecified: Secondary | ICD-10-CM

## 2012-08-12 DIAGNOSIS — B20 Human immunodeficiency virus [HIV] disease: Secondary | ICD-10-CM

## 2012-08-12 DIAGNOSIS — F39 Unspecified mood [affective] disorder: Secondary | ICD-10-CM

## 2012-08-12 MED ORDER — GABAPENTIN 400 MG PO CAPS: 400.0000 mg | ORAL_CAPSULE | Freq: Three times a day (TID) | ORAL | Status: DC

## 2012-08-12 MED ORDER — EMTRICITABINE-TENOFOVIR DF 200-300 MG PO TABS: 1.0000 | ORAL_TABLET | Freq: Every day | ORAL | Status: DC

## 2012-08-12 MED ORDER — ALPRAZOLAM 2 MG PO TABS: 2.0000 mg | ORAL_TABLET | Freq: Three times a day (TID) | ORAL | Status: DC | PRN

## 2012-08-12 MED ORDER — VENLAFAXINE HCL ER 150 MG PO CP24: 150.0000 mg | ORAL_CAPSULE | Freq: Every day | ORAL | Status: DC

## 2012-08-12 MED ORDER — RALTEGRAVIR POTASSIUM 400 MG PO TABS: 400.0000 mg | ORAL_TABLET | Freq: Two times a day (BID) | ORAL | Status: DC

## 2012-08-12 NOTE — Telephone Encounter (Signed)
Pt has been on Xanax since 2010.  MD please advise about quantity and # of refills.

## 2012-08-17 ENCOUNTER — Other Ambulatory Visit: Payer: Self-pay | Admitting: *Deleted

## 2012-08-17 DIAGNOSIS — F419 Anxiety disorder, unspecified: Secondary | ICD-10-CM

## 2012-08-17 MED ORDER — ALPRAZOLAM 2 MG PO TABS: 2.0000 mg | ORAL_TABLET | Freq: Three times a day (TID) | ORAL | Status: DC | PRN

## 2012-08-17 NOTE — Progress Notes (Signed)
New rx requested by pharmacy - Sam will cancel the one from 08/12/12. Ok'd by Dr. Ninetta Lights. Providence Lanius, Zachary George, RN

## 2012-09-02 ENCOUNTER — Other Ambulatory Visit: Payer: Medicare Other

## 2012-09-21 ENCOUNTER — Other Ambulatory Visit: Payer: Self-pay | Admitting: Infectious Diseases

## 2012-09-21 ENCOUNTER — Other Ambulatory Visit: Payer: Self-pay | Admitting: *Deleted

## 2012-09-21 DIAGNOSIS — B20 Human immunodeficiency virus [HIV] disease: Secondary | ICD-10-CM

## 2012-09-21 DIAGNOSIS — F39 Unspecified mood [affective] disorder: Secondary | ICD-10-CM

## 2012-09-21 DIAGNOSIS — F411 Generalized anxiety disorder: Secondary | ICD-10-CM

## 2012-09-21 MED ORDER — GABAPENTIN 400 MG PO CAPS: 400.0000 mg | ORAL_CAPSULE | Freq: Three times a day (TID) | ORAL | Status: DC

## 2012-09-21 MED ORDER — RALTEGRAVIR POTASSIUM 400 MG PO TABS: 400.0000 mg | ORAL_TABLET | Freq: Two times a day (BID) | ORAL | Status: DC

## 2012-09-21 MED ORDER — VENLAFAXINE HCL ER 150 MG PO CP24: 150.0000 mg | ORAL_CAPSULE | Freq: Every day | ORAL | Status: DC

## 2012-09-21 NOTE — Telephone Encounter (Signed)
Would like advice as to the patient's Xanax 2mg  TID PRN for sleep prescription.  Per the chart, patient is also seen at [facility].  Would this patient be appropriate for ADS services as well? Thank you.  Marcelino Duster

## 2012-09-21 NOTE — Telephone Encounter (Signed)
Could ask [facility] if they could help with his sleep issues. thanks

## 2012-09-22 ENCOUNTER — Telehealth: Payer: Self-pay | Admitting: *Deleted

## 2012-09-22 NOTE — Telephone Encounter (Signed)
thanks

## 2012-09-22 NOTE — Telephone Encounter (Signed)
RN called patient to offer services of ADS counselor, Max Menius.  Patient hesitant, worried that we would "police him" but agreed to see if counseling would help.  He reports occasional alcohol and cocaine use to "help him calm down," states that this started when he was homeless and had to "protect himself all the time."  Patient gets respiridone at [place], but states he felt the counselor there didn't help him so he only goes to get his prescription renewed every 10 weeks.  At end of conversation, patient willing to give our services a try.  Appointment given for 09/24/12 at 2pm. Andree Coss, RN

## 2012-09-24 ENCOUNTER — Ambulatory Visit: Payer: Medicare Other | Admitting: *Deleted

## 2012-09-29 ENCOUNTER — Ambulatory Visit: Payer: Medicare Other | Admitting: *Deleted

## 2012-09-29 DIAGNOSIS — F1021 Alcohol dependence, in remission: Secondary | ICD-10-CM
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
## 2012-10-13 ENCOUNTER — Ambulatory Visit: Payer: Medicare Other | Admitting: *Deleted

## 2012-10-22 ENCOUNTER — Ambulatory Visit: Payer: Medicare Other | Admitting: *Deleted

## 2012-10-22 DIAGNOSIS — F1021 Alcohol dependence, in remission: Secondary | ICD-10-CM

## 2012-10-22 NOTE — Progress Notes (Signed)
Patient ID: David English, male   DOB: 08-01-1950, 62 y.o.   MRN: [id number] Zarin arrived on time for session, well-groomed, lucid, and appropriately focused. He and counselor addressed Mikhai' feelings about his health and the low energy that he attributes mostly to age. Counselor further inquired into patient's use of substances and the extent to which Roshawn adheres to any type of self-imposed limits with his alcohol consumption.   Undrea explained that he enjoys drinking and that it relaxes him. He reiterated that he does not drink "too much". We addressed his conception of alcohol abuse and he expressed a clear notion of what that is for him and for other people that he occasionally socializes with who also drink. Kaylor defined excessive drinking as that which leads to hurtful or inappropriate behaviors. Writer expressed that there is a fine line between appropriate social use and that which is excessive or potentially harmful. Marty acknowledged this point and shared his view that drinking excessively has caused problems for him in the past. Consequently, he said that he remains more conscious now of the need to limit alcohol use otherwise he becomes vulnerable to being exploited by others. We looked at maintaining good health and wellness as a constellation of deliberate choices Salar can make - with avoiding alcohol abuse as a part of that.  Zale participated in this discussion while making it clear that his motivation for further changes is low. Counselor credited Bazil' willingness to process these personal issues. In closing, Manville shared that his psyc meds are effective for him and that he is generally pleased with his housing status and general health maintenance with the exception of low energy. Patient agreed to a return visit in 3 weeks for further discussion of his status. Counselor expressed a desire to clarify with Auther in future session other ways he might set  boundaries with visitors in his apartment who want to socialize and drink. Patient was receptive to this. Return appt set for May 15th at 2:00 p.m. Patient may contact this Clinical research associate at  [employer] before then if needing support. [facility] should continue to monitor for any SA issues in the interim.

## 2012-11-10 ENCOUNTER — Telehealth: Payer: Self-pay | Admitting: *Deleted

## 2012-11-10 NOTE — Telephone Encounter (Signed)
Patient called requesting a referral to an ENT to be evaluated for hearing loss. He has [employer] on his Medicaid card and I advised him to go to [facility] to have it changed to [facility] and we should then be able to do the referral. David English

## 2012-11-12 ENCOUNTER — Ambulatory Visit: Payer: Medicare Other | Admitting: *Deleted

## 2012-11-17 ENCOUNTER — Ambulatory Visit: Payer: Medicare Other | Admitting: *Deleted

## 2012-12-03 ENCOUNTER — Other Ambulatory Visit: Payer: Medicare Other

## 2012-12-03 DIAGNOSIS — Z79899 Other long term (current) drug therapy: Secondary | ICD-10-CM

## 2012-12-03 DIAGNOSIS — Z113 Encounter for screening for infections with a predominantly sexual mode of transmission: Secondary | ICD-10-CM

## 2012-12-03 DIAGNOSIS — B20 Human immunodeficiency virus [HIV] disease: Secondary | ICD-10-CM

## 2012-12-03 LAB — CBC
HCT: 43.6 % (ref 39.0–52.0)
Hemoglobin: 15.3 g/dL (ref 13.0–17.0)
MCH: 31.4 pg (ref 26.0–34.0)
MCHC: 35.1 g/dL (ref 30.0–36.0)
MCV: 89.5 fL (ref 78.0–100.0)
RDW: 13.7 % (ref 11.5–15.5)

## 2012-12-03 LAB — COMPREHENSIVE METABOLIC PANEL
AST: 36 U/L (ref 0–37)
Albumin: 4 g/dL (ref 3.5–5.2)
Alkaline Phosphatase: 140 U/L — ABNORMAL HIGH (ref 39–117)
BUN: 9 mg/dL (ref 6–23)
Creat: 1.46 mg/dL — ABNORMAL HIGH (ref 0.50–1.35)
Glucose, Bld: 112 mg/dL — ABNORMAL HIGH (ref 70–99)

## 2012-12-03 LAB — LIPID PANEL
HDL: 41 mg/dL (ref >39)
LDL Cholesterol: 62 mg/dL (ref 0–99)
Total CHOL/HDL Ratio: 4.1 Ratio
Triglycerides: 335 mg/dL — ABNORMAL HIGH (ref <150)

## 2012-12-04 LAB — HIV-1 RNA QUANT-NO REFLEX-BLD: HIV-1 RNA Quant, Log: 1.53 {Log} — ABNORMAL HIGH (ref <1.30)

## 2012-12-04 LAB — RPR

## 2012-12-14 ENCOUNTER — Encounter: Payer: Self-pay | Admitting: Infectious Diseases

## 2012-12-14 ENCOUNTER — Ambulatory Visit (INDEPENDENT_AMBULATORY_CARE_PROVIDER_SITE_OTHER): Payer: Medicare Other | Admitting: Infectious Diseases

## 2012-12-14 ENCOUNTER — Other Ambulatory Visit: Payer: Self-pay | Admitting: Infectious Diseases

## 2012-12-14 VITALS — BP 100/73 | HR 59 | Temp 98.4°F | Ht 70.5 in | Wt 228.0 lb

## 2012-12-14 DIAGNOSIS — Z720 Tobacco use: Secondary | ICD-10-CM

## 2012-12-14 DIAGNOSIS — F39 Unspecified mood [affective] disorder: Secondary | ICD-10-CM

## 2012-12-14 DIAGNOSIS — N289 Disorder of kidney and ureter, unspecified: Secondary | ICD-10-CM

## 2012-12-14 DIAGNOSIS — B20 Human immunodeficiency virus [HIV] disease: Secondary | ICD-10-CM

## 2012-12-14 DIAGNOSIS — F17200 Nicotine dependence, unspecified, uncomplicated: Secondary | ICD-10-CM

## 2012-12-14 NOTE — Assessment & Plan Note (Signed)
He is doing well. Will see him back in 6 months. He is offered condoms.

## 2012-12-14 NOTE — Assessment & Plan Note (Signed)
Will continue to f/u at [facility]

## 2012-12-14 NOTE — Assessment & Plan Note (Signed)
Will cont to watch. His Cr is slightly better.

## 2012-12-14 NOTE — Progress Notes (Signed)
  Subjective:    Patient ID: David English, male    DOB: 1951/03/24, 62 y.o.   MRN: [id number]  HPI 62 yo M with hx of HIV+ since 2000. He has been hospitalized in 2006 and 2008 (?) with pneumonia. He has been on ISN/TRV since 2011 . Of note his Cr has increased: 1.25 -> 1.58 --> 1.8 --> 1.63 --> 1.61--> 1.7 and now 1.5.  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  Has been seen at [facility] center. Was taking risperidone, xanax, effexor, neurontin.   HIV 1 RNA Quant (copies/mL)  Date Value  12/03/2012 34*  05/21/2012 <20   11/20/2011 <20      CD4 T Cell Abs (cmm)  Date Value  12/03/2012 260*  05/21/2012 360*  11/20/2011 260*   Has been loosing hearing in his R ear for last 2 months. Seen at [facility]. Had cerumen disimpaction.  Recounts tension with [facility] center physician.    Review of Systems     Objective:   Physical Exam  Constitutional: He appears well-developed and well-nourished.  HENT:  Ears:  Eyes: EOM are normal. Pupils are equal, round, and reactive to light.  Cardiovascular: Normal rate, regular rhythm and normal heart sounds.   Pulmonary/Chest: Effort normal and breath sounds normal.  Abdominal: Soft. Bowel sounds are normal. There is no tenderness.  Skin: Nails show clubbing.  Psychiatric: His mood appears not anxious. His affect is not angry, not blunt and not labile. His speech is rapid and/or pressured and tangential. He does not exhibit a depressed mood.          Assessment & Plan:

## 2012-12-14 NOTE — Assessment & Plan Note (Signed)
Encouraged to quit. 

## 2012-12-15 ENCOUNTER — Other Ambulatory Visit: Payer: Self-pay | Admitting: Licensed Clinical Social Worker

## 2012-12-15 DIAGNOSIS — F411 Generalized anxiety disorder: Secondary | ICD-10-CM

## 2012-12-15 MED ORDER — ALPRAZOLAM 2 MG PO TABS: ORAL_TABLET | ORAL | Status: DC

## 2012-12-17 ENCOUNTER — Ambulatory Visit: Payer: Medicare Other | Admitting: Infectious Diseases

## 2013-03-10 ENCOUNTER — Other Ambulatory Visit: Payer: Self-pay | Admitting: Infectious Diseases

## 2013-03-10 DIAGNOSIS — F411 Generalized anxiety disorder: Secondary | ICD-10-CM

## 2013-04-02 ENCOUNTER — Other Ambulatory Visit: Payer: Self-pay | Admitting: *Deleted

## 2013-04-02 DIAGNOSIS — F39 Unspecified mood [affective] disorder: Secondary | ICD-10-CM

## 2013-04-02 DIAGNOSIS — B20 Human immunodeficiency virus [HIV] disease: Secondary | ICD-10-CM

## 2013-04-02 MED ORDER — VENLAFAXINE HCL ER 150 MG PO CP24: 150.0000 mg | ORAL_CAPSULE | Freq: Every day | ORAL | Status: AC

## 2013-04-02 MED ORDER — GABAPENTIN 400 MG PO CAPS: 400.0000 mg | ORAL_CAPSULE | Freq: Three times a day (TID) | ORAL | Status: AC

## 2013-04-02 MED ORDER — EMTRICITABINE-TENOFOVIR DF 200-300 MG PO TABS: ORAL_TABLET | ORAL | Status: DC

## 2013-04-02 MED ORDER — RALTEGRAVIR POTASSIUM 400 MG PO TABS: 400.0000 mg | ORAL_TABLET | Freq: Two times a day (BID) | ORAL | Status: AC

## 2013-05-11 ENCOUNTER — Telehealth: Payer: Self-pay | Admitting: *Deleted

## 2013-05-11 NOTE — Telephone Encounter (Signed)
Patient called and ask for a refill on his Xanax and he advised that Dr Ninetta Lights was to refill the Rx for him. Advised the patient we have not seen him since 11/2012 and per that note he was to follow up with [facility]. He advised he does not see what that has to do with his Xanax Rx he still has bad nerves and would not say if he is seeing anyone. Advised him that if he is following up they should refill his Rx. He asked me to just ask Dr Ninetta Lights and get back to him with the answer. Advised him he is not in the office today but as soon as I get an answer I will call him back.

## 2013-05-12 ENCOUNTER — Telehealth: Payer: Self-pay | Admitting: *Deleted

## 2013-05-12 NOTE — Telephone Encounter (Signed)
Called patient and advised him that per the last office visit note he was to follow up with [facility] and he advised he did but they do not fill this Rx. He advised they gave him a Rx but it was a generic and he did not like it. Advised him that was one of the reasons Dr Ninetta Lights referred him to [facility] and that he will need to follow up with his doctor there to get a refill on this medication. He asked what if they will not refill and I advised him if the doctor there has any questions they can call and we will talk to them directly but he does need to follow up with them. He advised he has not had an appt in a while and is not due to see them until 05/2013 but will call tomorrow in the morning.

## 2013-06-21 ENCOUNTER — Other Ambulatory Visit: Payer: Medicare Other

## 2013-06-21 DIAGNOSIS — B20 Human immunodeficiency virus [HIV] disease: Secondary | ICD-10-CM

## 2013-06-21 LAB — CBC WITH DIFFERENTIAL/PLATELET
Basophils Absolute: 0 10*3/uL (ref 0.0–0.1)
Basophils Relative: 0 % (ref 0–1)
Eosinophils Absolute: 0.1 10*3/uL (ref 0.0–0.7)
Eosinophils Relative: 2 % (ref 0–5)
Lymphs Abs: 1.8 10*3/uL (ref 0.7–4.0)
MCH: 31.5 pg (ref 26.0–34.0)
MCHC: 34.3 g/dL (ref 30.0–36.0)
MCV: 91.8 fL (ref 78.0–100.0)
Monocytes Absolute: 0.5 10*3/uL (ref 0.1–1.0)
Monocytes Relative: 9 % (ref 3–12)
Neutrophils Relative %: 57 % (ref 43–77)
Platelets: 222 10*3/uL (ref 150–400)
RBC: 5.02 MIL/uL (ref 4.22–5.81)

## 2013-06-21 LAB — COMPLETE METABOLIC PANEL WITH GFR
Albumin: 4.6 g/dL (ref 3.5–5.2)
Alkaline Phosphatase: 140 U/L — ABNORMAL HIGH (ref 39–117)
BUN: 11 mg/dL (ref 6–23)
CO2: 28 mEq/L (ref 19–32)
Calcium: 9.7 mg/dL (ref 8.4–10.5)
Chloride: 99 mEq/L (ref 96–112)
Creat: 1.68 mg/dL — ABNORMAL HIGH (ref 0.50–1.35)
GFR, Est Non African American: 43 mL/min — ABNORMAL LOW
Glucose, Bld: 103 mg/dL — ABNORMAL HIGH (ref 70–99)
Potassium: 4.4 mEq/L (ref 3.5–5.3)

## 2013-06-22 LAB — T-HELPER CELL (CD4) - (RCID CLINIC ONLY)
CD4 % Helper T Cell: 19 % — ABNORMAL LOW (ref 33–55)
CD4 T Cell Abs: 360 /uL — ABNORMAL LOW (ref 400–2700)

## 2013-06-23 LAB — HIV-1 RNA QUANT-NO REFLEX-BLD: HIV 1 RNA Quant: <20 copies/mL (ref <20)

## 2013-07-05 ENCOUNTER — Ambulatory Visit: Payer: Medicare Other | Admitting: Infectious Diseases

## 2013-07-14 ENCOUNTER — Ambulatory Visit: Payer: Medicare Other | Admitting: Infectious Diseases

## 2013-10-15 ENCOUNTER — Encounter (HOSPITAL_COMMUNITY): Payer: Self-pay | Admitting: Emergency Medicine

## 2013-10-15 ENCOUNTER — Emergency Department (HOSPITAL_COMMUNITY)
Admission: EM | Admit: 2013-10-15 | Discharge: 2013-10-15 | Disposition: A | Discharge status: Home / Self Care | Payer: Medicare Other | Attending: Emergency Medicine | Admitting: Emergency Medicine

## 2013-10-15 DIAGNOSIS — Z8619 Personal history of other infectious and parasitic diseases: Secondary | ICD-10-CM | POA: Insufficient documentation

## 2013-10-15 DIAGNOSIS — R638 Other symptoms and signs concerning food and fluid intake: Secondary | ICD-10-CM | POA: Insufficient documentation

## 2013-10-15 DIAGNOSIS — F1010 Alcohol abuse, uncomplicated: Secondary | ICD-10-CM

## 2013-10-15 DIAGNOSIS — Z880 Allergy status to penicillin: Secondary | ICD-10-CM | POA: Insufficient documentation

## 2013-10-15 DIAGNOSIS — F17200 Nicotine dependence, unspecified, uncomplicated: Secondary | ICD-10-CM | POA: Insufficient documentation

## 2013-10-15 DIAGNOSIS — Z79899 Other long term (current) drug therapy: Secondary | ICD-10-CM | POA: Insufficient documentation

## 2013-10-15 DIAGNOSIS — Z21 Asymptomatic human immunodeficiency virus [HIV] infection status: Secondary | ICD-10-CM | POA: Insufficient documentation

## 2013-10-15 DIAGNOSIS — R112 Nausea with vomiting, unspecified: Secondary | ICD-10-CM | POA: Insufficient documentation

## 2013-10-15 LAB — CBG MONITORING, ED: Glucose-Capillary: 128 mg/dL — ABNORMAL HIGH (ref 70–99)

## 2013-10-15 NOTE — ED Notes (Signed)
Pt states that he is ready to leave. Pt signing out AMA.

## 2013-10-15 NOTE — ED Notes (Signed)
[facility] Expected date:  Expected time:  Means of arrival:  Comments: EMS- ETOH intoxication, nonambulatory

## 2013-10-15 NOTE — Discharge Instructions (Signed)
°Emergency Department Resource Guide °1) Find a Doctor and Pay Out of Pocket °Although you won't have to find out who is covered by your insurance plan, it is a good idea to ask around and get recommendations. You will then need to call the office and see if the doctor you have chosen will accept you as a new patient and what types of options they offer for patients who are self-pay. Some doctors offer discounts or will set up payment plans for their patients who do not have insurance, but you will need to ask so you aren't surprised when you get to your appointment. ° °2) Contact Your Local Health Department °Not all health departments have doctors that can see patients for sick visits, but many do, so it is worth a call to see if yours does. If you don't know where your local health department is, you can check in your phone book. The CDC also has a tool to help you locate your state's health department, and many state websites also have listings of all of their local health departments. ° °3) Find a Walk-in Clinic °If your illness is not likely to be very severe or complicated, you may want to try a walk in clinic. These are popping up all over the country in pharmacies, drugstores, and shopping centers. They're usually staffed by nurse practitioners or physician assistants that have been trained to treat common illnesses and complaints. They're usually fairly quick and inexpensive. However, if you have serious medical issues or chronic medical problems, these are probably not your best option. ° °No Primary Care Doctor: °- Call [facility] at  832-8000 - they can help you locate a primary care doctor that  accepts your insurance, provides certain services, etc. °- Physician Referral Service- 1-800-533-3463 ° °Chronic Pain Problems: °Organization         Address  Phone   Notes  °[facility]  (336) 297-2271 Patients need to be referred by their primary care doctor.  ° °Medication  Assistance: °Organization         Address  Phone   Notes  °[place] [facility] [address] °[place], [place] [address] (336) 641-8030 --Must be a resident of [place] °-- Must have NO insurance coverage whatsoever (no Medicaid/ Medicare, etc.) °-- The pt. MUST have a primary care doctor that directs their care regularly and follows them in the community °  °[facility]  (866) 331-1348   °[employer]  (888) 892-1162   ° °Agencies that provide inexpensive medical care: °Organization         Address  Phone   Notes  °[facility]  (336) 832-8035   °[facility]    (336) 832-7272   °[facility] [address] °[place], [place] [address] (336) 832-4777   °[facility] [address], °[place] (336) 271-4999   °[facility]    (336) 373-0678   °[facility]    (336) 272-1050   °[facility] ° [address], [place] Phone:  (336) 832-4444, Fax:  (336) 832-4440 Hours of Operation:  9 am - 6 pm, M-F.  Also accepts Medicaid/Medicare and self-pay.  °[facility] ° [address], [place] Phone: (336) 832-3150, Fax: (336) 832-3151. Hours of Operation:  8:30 am - 5:30 pm, M-F.  Also accepts Medicaid and self-pay.  °[facility] [place] [address], [place] Phone: (336) 878-6027   °[facility] [address], [place], [place] (336)723-1848, Ext. 123 Mondays & Thursdays: 7-9 AM.  First 15 patients are seen on a first come, first serve basis. °  ° °Medicaid-accepting [place] Providers: ° °Organization         Address  Phone   Notes  °[facility] [address], [place] (336) 641-2100 Also accepts self-pay patients.  °[facility] [address], [place] ° (336) 856-9996   °[facility] [address], [place]  (336) 288-8857   °[facility] [address] [place] [address], [place] (336) 299-7000   °Veita Bland [address], [place]  ° (336) 373-1557 Only accepts [place] Access Medicaid patients after they have their name applied to their card.  ° °Self-Pay (no insurance) in [place]: ° °Organization         Address  Phone   Notes  °Sickle Cell Patients, [facility] [address], [place] (336) 832-1970   °[facility] [address], [place] (336) 832-4400   °[facility] ° [address], [place] (336) 992-4800   °[facility]/Dr. Osei-Bonsu ° [address] [place] [address], [place] or [address], [place] (336) 841-8500 Phone number for both [place] and [place] locations is the same.  °[facility] [address], [place] (336) 299-0000   °[facility] [address] [place] [address], [place] or [address] (336) 852-7530 °(336) 878-2260   °[facility] [address], [place] (336) 350-1642, phone; (336) 294-5005, fax Sees patients 1st and 3rd Saturday of every month.  Must not qualify for public or private insurance (i.e. Medicaid, Medicare, [place] Health Choice, Veterans' Benefits) • Household income should be no more than 200% of the poverty level •The clinic cannot treat you if you are pregnant or think you are pregnant • Sexually transmitted diseases are not treated at the clinic.  ° ° °Dental Care: °Organization         Address  Phone  Notes  °[place] [facility] [address], [place] (336) 641-6152 Accepts children up to age 21 who are enrolled in Medicaid or [place] Health Choice; pregnant women with a Medicaid card; and children who have applied for Medicaid or [place] Health Choice, but were declined, whose parents can pay a reduced fee at time of service.  °[place]  [facility] [place]  [address], [place] (336) 641-7733 Accepts children up to age 21 who are enrolled in Medicaid or [place] Health Choice; pregnant women with a Medicaid card; and children who have applied for Medicaid or [place] Health Choice, but were declined, whose parents can pay a reduced fee at time of service.  °[facility] ° [address],  (336) 641-4533 Patients are seen by appointment only. Walk-ins are not accepted. [facility] will see patients 18 years of age and older. °Monday - Tuesday (8am-5pm) °Most Wednesdays (8:30-5pm) °$30 per visit, cash only  °[facility] ° [address], [place] (336) 641-4533 Patients are seen by appointment only. Walk-ins are not accepted. [facility] will see patients 18 years of age and older. °One   Wednesday Evening (Monthly: Volunteer Based).  $30 per visit, cash only  °[facility]  (919) 537-3737 for adults; Children under age 4, call Graduate Pediatric Dentistry at (919) [phone]. Children aged 4-14, please call (919) [phone] to request a pediatric application. ° Dental services are provided in all areas of dental care including fillings, crowns and bridges, complete and partial dentures, implants, gum treatment, root canals, and extractions. Preventive care is also provided. Treatment is provided to both adults and children. °Patients are selected via a lottery and there is often a waiting list. °  °[facility] [address], °[place] ° (336) 763-8833 [email or web address] °  °[facility] [address], [place], [place] (336)723-1848, Ext. 123 Second and Fourth Thursday of each month, opens at 6:30 AM; Clinic ends at 9 AM.  Patients are seen on a first-come first-served basis, and a limited number are seen during each clinic.  ° °[facility] ° [address], [place], [place] (336) 723-7904    Eligibility Requirements °You must have lived in [place], [place], or [place] counties for at least the last three months. °  You cannot be eligible for state or federal sponsored healthcare insurance, including Veterans Administration, Medicaid, or Medicare. °  You generally cannot be eligible for healthcare insurance through your employer.  °  How to apply: °Eligibility screenings are held every Tuesday and Wednesday afternoon from 1:00 pm until 4:00 pm. You do not need an appointment for the interview!  °[facility] [address], [place], [place] 336-631-2330   °[facility]  336-342-8273   °[facility]  336-703-3100   °[facility]  336-570-6415   ° °Behavioral Health Resources in the Community: °Intensive Outpatient Programs °Organization         Address  Phone  Notes  °[facility] [address], [place], [place] 336-878-6098   °[facility] [address], [place], [place] 336-832-9800   °[facility] [address], [place], [place] ° 336-882-2125   °[facility] [address],  °[place], [place] 1-800-853-5163 or 336-641-4981   °Substance Abuse Resources °Organization         Address  Phone  Notes  °[facility]  336-882-2125   °[facility]  336-784-9470   °[facility]  336-285-9073   °[facility]  336-845-3988   °Residential & Outpatient Substance Abuse Program  1-800-659-3381   °Psychological Services °Organization         Address  Phone  Notes  ° Health  336- 832-9600   °[facility]  336- 378-7881   °[facility] [address], [place] 1-800-853-5163 or 336-641-4981   ° °Mobile Crisis Teams °Organization         Address  Phone  Notes  °[facility], Mobile Crisis Care Unit  1-877-626-1772   °Assertive °[facility] ° [address]  [place], [place] 336-834-9664   °Sharon DeEsch [address] °[place] 336-554-5454   ° °Self-Help/Support Groups °Organization         Address  Phone             Notes  °[facility]  - variety of support groups  336- 373-1402 Call for more information  °Narcotics Anonymous (NA), [facility] [address], °[place]   2 meetings at this location  ° °  Residential Treatment Programs °Organization         Address  Phone  Notes  °[facility] [address],    °[place]  1-866-801-8205   °[facility] ° [address], [place], [place] 704-293-8524   °[facility] [address], [place] 336-845-3988 Admissions: 8am-3pm M-F  °[facility] [address]    °[place], [place] 336-841-1104   °[facility] [address], [place], [place] 336-379-7146   °[facility] [address]  °[place], [place] 336-285-9073   °[facility] Intensive Outpatient [address], [place], [place] 336-852-3033   °[facility] [address]  °[place], [place] 1-877-615-2722 or 336-784-9470   °[facility] [address] [place], [place] 336-227-7417 Accepts Medicaid  °[facility] [address]  °[place] 1-800-659-3381 Substance Abuse/Addiction Treatment  ° °[place] Behavioral Health Resources °Organization         Address  Phone  Notes  °[facility]  (888) 581-9988   °Julie Brannon, PhD [address] [place], [place]   (336) 349-5553 or (336) 951-0000   °[facility]   [address] °[place], [place] (336) 349-4454   °[facility] [address], [place], [place] (336) 342-8316 Insurance/Medicaid/sponsorship through [facility]  °[facility] [address]                                    [place], [place] (336) 342-8316 Therapy/tele-psych/case    °[facility] [address]  ° [place], [place] (336) 349-2233    °Dr. Arfeen  (336) 349-4544   °[facility] [place]  [employer] [place] [facility] 1) [address],  °2) [address], [place] °3)  [address], [place] (336) 349-3220 °(336) 342-7768 ° °(336) 342-8140   °[place] [facility] (336) 342-1394 or (336) 342-3537 (After Hours)    ° ° °

## 2013-10-15 NOTE — ED Provider Notes (Signed)
CSN: [id number]     Arrival date & time 10/15/13  1034 History   First MD Initiated Contact with Patient 10/15/13 1115     Chief Complaint  Patient presents with  . Alcohol Intoxication  . Nausea  . Emesis     (Consider location/radiation/quality/duration/timing/severity/associated sxs/prior Treatment) HPI Comments: 63 year old male with history of HIV, tobacco use , syphilis, alcohol abuse, high blood pressure presents with alcohol intoxication. Patient drinks hard alcohol fairly regularly. This morning had more than he should have infarct, per patient. Patient feels she is gradually improving while sitting in the ER. He denies fevers chills cough cold or other symptoms. Patient had mild nausea and vomiting earlier that has improved. No head injury.  Patient is a 63 y.o. male presenting with intoxication and vomiting. The history is provided by the patient.  Alcohol Intoxication Pertinent negatives include no chest pain, no abdominal pain, no headaches and no shortness of breath.  Emesis Associated symptoms: no abdominal pain, no chills and no headaches     Past Medical History  Diagnosis Date  . HIV (human immunodeficiency virus infection)    History reviewed. No pertinent past surgical history. Family History  Problem Relation Age of Onset  . Heart disease Mother   . Pneumonia Father    History  Substance Use Topics  . Smoking status: Current Some Day Smoker -- 0.30 packs/day for 30 years    Types: Cigarettes  . Smokeless tobacco: Never Used     Comment: cutting down  . Alcohol Use: 3.0 oz/week    6 drink(s) per week     Comment: vodka    Review of Systems  Constitutional: Positive for appetite change. Negative for fever and chills.  HENT: Negative for congestion.   Eyes: Negative for visual disturbance.  Respiratory: Negative for shortness of breath.   Cardiovascular: Negative for chest pain.  Gastrointestinal: Positive for nausea and vomiting. Negative for  abdominal pain.  Genitourinary: Negative for dysuria and flank pain.  Musculoskeletal: Negative for back pain, neck pain and neck stiffness.  Skin: Negative for rash.  Neurological: Negative for light-headedness and headaches.      Allergies  Penicillins  Home Medications   Prior to Admission medications   Medication Sig Start Date End Date Taking? Authorizing Provider  alprazolam Prudy Feeler(XANAX) 2 MG tablet Take 2 mg by mouth 3 (three) times daily.   Yes Historical Provider, MD  emtricitabine-tenofovir (TRUVADA) 200-300 MG per tablet Take 1 tablet by mouth daily.   Yes Historical Provider, MD  gabapentin (NEURONTIN) 400 MG capsule Take 1 capsule (400 mg total) by mouth 3 (three) times daily. 04/02/13  Yes Ginnie SmartJeffrey C Hatcher, MD  raltegravir (ISENTRESS) 400 MG tablet Take 1 tablet (400 mg total) by mouth 2 (two) times daily. 04/02/13  Yes Ginnie SmartJeffrey C Hatcher, MD  venlafaxine XR (EFFEXOR-XR) 150 MG 24 hr capsule Take 1 capsule (150 mg total) by mouth daily. 04/02/13  Yes Ginnie SmartJeffrey C Hatcher, MD   BP 119/70  Pulse 88  Temp(Src) 97.6 F (36.4 C) (Oral)  Resp 17  SpO2 98% Physical Exam  Nursing note and vitals reviewed. Constitutional: He is oriented to person, place, and time. He appears well-developed and well-nourished.  HENT:  Head: Normocephalic and atraumatic.  Mild dry mucous membranes  Eyes: Conjunctivae are normal. Right eye exhibits no discharge. Left eye exhibits no discharge.  Mild conjunctival injection bilateral.  Neck: Normal range of motion. Neck supple. No tracheal deviation present.  Cardiovascular: Normal rate and regular rhythm.  Pulmonary/Chest: Effort normal and breath sounds normal.  Abdominal: Soft. He exhibits no distension. There is no tenderness. There is no guarding.  Musculoskeletal: He exhibits no edema.  Neurological: He is alert and oriented to person, place, and time. GCS eye subscore is 4. GCS verbal subscore is 5. GCS motor subscore is 6.  5+ strength in UE  and LE with f/e at major joints. Sensation to palpation intact in UE and LE. CNs 2-12 grossly intact.  EOMFI.  PERRL.   Finger nose and coordination intact bilateral.   Visual fields intact to finger testing. Mild clinical intoxication.  Skin: Skin is warm. No rash noted.  Psychiatric:  Mild clinical intox    ED Course  Procedures (including critical care time) Labs Review Labs Reviewed  CBG MONITORING, ED - Abnormal; Notable for the following:    Glucose-Capillary 128 (*)    All other components within normal limits    Imaging Review No results found.   EKG Interpretation None      MDM   Final diagnoses:  Alcohol abuse  Nausea & vomiting   Patient with clinical intoxication. Patient observed in the ER and during discussion patient alert oriented and appropriate. Patient is not suicidal admits to chronic alcohol abuse. During discussion he is pleasant, thankful that were giving him food and fluids well he sobers up. Neuro exam unremarkable. Once patient able to ambulate without difficulty he is safe to be discharged. No blood work indicated at this time vitals normal. Patient living without difficulty and will followup outpatient. Patient not clinically intoxicated recheck.. patient tolerated by mouth in ED. Results and differential diagnosis were discussed with the patient. Close follow up outpatient was discussed, patient comfortable with the plan.   Filed Vitals:   10/15/13 1039  BP: 119/70  Pulse: 88  Temp: 97.6 F (36.4 C)  TempSrc: Oral  Resp: 17  SpO2: 98%     Enid SkeensJoshua M Mylez Venable, MD 10/15/13 1626

## 2013-10-15 NOTE — ED Notes (Signed)
Zavitz, MD at bedside 

## 2013-10-15 NOTE — ED Notes (Signed)
[place] [employer] [place] profuse [place]. Pt states to EMS that he does sweat a lot. Pt was laying on bench and smelt of ETOH. Pt told EMS that after vomiting once with them that he felt better. Pt has red liquid in his gatorade bottle that pt told EMS was vodka and he started drinking at 0700.  Pt states to EMS that he doesn't need help for substance abuse. Pt is HIV positive. CBG 118. 152/94, 88. Pt denied pain. Pt resting on stretcher with eyes closed.

## 2013-10-15 NOTE — ED Notes (Signed)
Asked pt why he came to ED today. Pt mumbled "i dont know".
